# Patient Record
Sex: Male | Born: 1935 | Race: White | Hispanic: No | Smoking: Former smoker
Health system: Southern US, Community
[De-identification: ages and names within clinical notes are randomized; demographics above are authoritative.]

## PROBLEM LIST (undated history)

## (undated) DIAGNOSIS — Z87442 Personal history of urinary calculi: Secondary | ICD-10-CM

## (undated) DIAGNOSIS — I509 Heart failure, unspecified: Secondary | ICD-10-CM

## (undated) DIAGNOSIS — R319 Hematuria, unspecified: Secondary | ICD-10-CM

## (undated) DIAGNOSIS — M5126 Other intervertebral disc displacement, lumbar region: Secondary | ICD-10-CM

## (undated) DIAGNOSIS — E119 Type 2 diabetes mellitus without complications: Secondary | ICD-10-CM

## (undated) DIAGNOSIS — N183 Chronic kidney disease, stage 3 unspecified: Secondary | ICD-10-CM

## (undated) DIAGNOSIS — M1990 Unspecified osteoarthritis, unspecified site: Secondary | ICD-10-CM

## (undated) DIAGNOSIS — I219 Acute myocardial infarction, unspecified: Secondary | ICD-10-CM

## (undated) DIAGNOSIS — C61 Malignant neoplasm of prostate: Secondary | ICD-10-CM

## (undated) DIAGNOSIS — E7800 Pure hypercholesterolemia, unspecified: Secondary | ICD-10-CM

## (undated) DIAGNOSIS — Z950 Presence of cardiac pacemaker: Secondary | ICD-10-CM

## (undated) DIAGNOSIS — I10 Essential (primary) hypertension: Secondary | ICD-10-CM

## (undated) DIAGNOSIS — N12 Tubulo-interstitial nephritis, not specified as acute or chronic: Secondary | ICD-10-CM

## (undated) DIAGNOSIS — J449 Chronic obstructive pulmonary disease, unspecified: Secondary | ICD-10-CM

## (undated) HISTORY — PX: CORONARY ANGIOPLASTY WITH STENT PLACEMENT: SHX49

## (undated) HISTORY — PX: INSERTION PROSTATE RADIATION SEED: SUR718

## (undated) HISTORY — PX: CORONARY ARTERY BYPASS GRAFT: SHX141

## (undated) HISTORY — PX: APPENDECTOMY: SHX54

## (undated) HISTORY — PX: CATARACT EXTRACTION W/ INTRAOCULAR LENS  IMPLANT, BILATERAL: SHX1307

## (undated) HISTORY — PX: JOINT REPLACEMENT: SHX530

## (undated) HISTORY — PX: INGUINAL HERNIA REPAIR: SUR1180

---

## 1957-12-19 DIAGNOSIS — Z87442 Personal history of urinary calculi: Secondary | ICD-10-CM

## 1957-12-19 HISTORY — PX: CYSTOSCOPY W/ STONE MANIPULATION: SHX1427

## 1957-12-19 HISTORY — DX: Personal history of urinary calculi: Z87.442

## 2003-12-20 HISTORY — PX: TOTAL SHOULDER ARTHROPLASTY: SHX126

## 2008-12-19 HISTORY — PX: INSERT / REPLACE / REMOVE PACEMAKER: SUR710

## 2016-11-02 ENCOUNTER — Emergency Department (HOSPITAL_COMMUNITY): Payer: Medicare Other

## 2016-11-02 ENCOUNTER — Encounter (HOSPITAL_COMMUNITY): Payer: Self-pay | Admitting: *Deleted

## 2016-11-02 ENCOUNTER — Inpatient Hospital Stay (HOSPITAL_COMMUNITY)
Admission: EM | Admit: 2016-11-02 | Discharge: 2016-11-09 | DRG: 291 | Disposition: A | Discharge status: Skilled Nursing Facility | Payer: Medicare Other | Attending: Internal Medicine | Admitting: Internal Medicine

## 2016-11-02 DIAGNOSIS — I130 Hypertensive heart and chronic kidney disease with heart failure and stage 1 through stage 4 chronic kidney disease, or unspecified chronic kidney disease: Principal | ICD-10-CM | POA: Diagnosis present

## 2016-11-02 DIAGNOSIS — B3789 Other sites of candidiasis: Secondary | ICD-10-CM | POA: Diagnosis present

## 2016-11-02 DIAGNOSIS — N183 Chronic kidney disease, stage 3 unspecified: Secondary | ICD-10-CM | POA: Diagnosis present

## 2016-11-02 DIAGNOSIS — R778 Other specified abnormalities of plasma proteins: Secondary | ICD-10-CM

## 2016-11-02 DIAGNOSIS — Z882 Allergy status to sulfonamides status: Secondary | ICD-10-CM

## 2016-11-02 DIAGNOSIS — K5900 Constipation, unspecified: Secondary | ICD-10-CM | POA: Diagnosis not present

## 2016-11-02 DIAGNOSIS — R0602 Shortness of breath: Secondary | ICD-10-CM | POA: Diagnosis present

## 2016-11-02 DIAGNOSIS — N179 Acute kidney failure, unspecified: Secondary | ICD-10-CM | POA: Diagnosis present

## 2016-11-02 DIAGNOSIS — Z96611 Presence of right artificial shoulder joint: Secondary | ICD-10-CM | POA: Diagnosis present

## 2016-11-02 DIAGNOSIS — R109 Unspecified abdominal pain: Secondary | ICD-10-CM

## 2016-11-02 DIAGNOSIS — E119 Type 2 diabetes mellitus without complications: Secondary | ICD-10-CM

## 2016-11-02 DIAGNOSIS — B3742 Candidal balanitis: Secondary | ICD-10-CM

## 2016-11-02 DIAGNOSIS — R748 Abnormal levels of other serum enzymes: Secondary | ICD-10-CM | POA: Diagnosis present

## 2016-11-02 DIAGNOSIS — J449 Chronic obstructive pulmonary disease, unspecified: Secondary | ICD-10-CM | POA: Diagnosis present

## 2016-11-02 DIAGNOSIS — I5023 Acute on chronic systolic (congestive) heart failure: Secondary | ICD-10-CM | POA: Diagnosis not present

## 2016-11-02 DIAGNOSIS — Z9841 Cataract extraction status, right eye: Secondary | ICD-10-CM

## 2016-11-02 DIAGNOSIS — E1122 Type 2 diabetes mellitus with diabetic chronic kidney disease: Secondary | ICD-10-CM | POA: Diagnosis present

## 2016-11-02 DIAGNOSIS — I480 Paroxysmal atrial fibrillation: Secondary | ICD-10-CM | POA: Diagnosis not present

## 2016-11-02 DIAGNOSIS — C61 Malignant neoplasm of prostate: Secondary | ICD-10-CM | POA: Diagnosis present

## 2016-11-02 DIAGNOSIS — Z955 Presence of coronary angioplasty implant and graft: Secondary | ICD-10-CM

## 2016-11-02 DIAGNOSIS — Z9842 Cataract extraction status, left eye: Secondary | ICD-10-CM

## 2016-11-02 DIAGNOSIS — E785 Hyperlipidemia, unspecified: Secondary | ICD-10-CM

## 2016-11-02 DIAGNOSIS — Z95810 Presence of automatic (implantable) cardiac defibrillator: Secondary | ICD-10-CM

## 2016-11-02 DIAGNOSIS — N12 Tubulo-interstitial nephritis, not specified as acute or chronic: Secondary | ICD-10-CM | POA: Diagnosis present

## 2016-11-02 DIAGNOSIS — M549 Dorsalgia, unspecified: Secondary | ICD-10-CM | POA: Diagnosis not present

## 2016-11-02 DIAGNOSIS — I2510 Atherosclerotic heart disease of native coronary artery without angina pectoris: Secondary | ICD-10-CM | POA: Diagnosis not present

## 2016-11-02 DIAGNOSIS — Z951 Presence of aortocoronary bypass graft: Secondary | ICD-10-CM

## 2016-11-02 DIAGNOSIS — Z961 Presence of intraocular lens: Secondary | ICD-10-CM | POA: Diagnosis present

## 2016-11-02 DIAGNOSIS — Z87442 Personal history of urinary calculi: Secondary | ICD-10-CM

## 2016-11-02 DIAGNOSIS — B3749 Other urogenital candidiasis: Secondary | ICD-10-CM | POA: Diagnosis present

## 2016-11-02 DIAGNOSIS — E871 Hypo-osmolality and hyponatremia: Secondary | ICD-10-CM | POA: Diagnosis present

## 2016-11-02 DIAGNOSIS — Z950 Presence of cardiac pacemaker: Secondary | ICD-10-CM

## 2016-11-02 DIAGNOSIS — R7989 Other specified abnormal findings of blood chemistry: Secondary | ICD-10-CM

## 2016-11-02 DIAGNOSIS — K219 Gastro-esophageal reflux disease without esophagitis: Secondary | ICD-10-CM | POA: Diagnosis present

## 2016-11-02 DIAGNOSIS — I4891 Unspecified atrial fibrillation: Secondary | ICD-10-CM | POA: Diagnosis present

## 2016-11-02 DIAGNOSIS — I10 Essential (primary) hypertension: Secondary | ICD-10-CM | POA: Diagnosis present

## 2016-11-02 DIAGNOSIS — Z87891 Personal history of nicotine dependence: Secondary | ICD-10-CM

## 2016-11-02 HISTORY — DX: Tubulo-interstitial nephritis, not specified as acute or chronic: N12

## 2016-11-02 HISTORY — DX: Personal history of urinary calculi: Z87.442

## 2016-11-02 HISTORY — DX: Acute myocardial infarction, unspecified: I21.9

## 2016-11-02 HISTORY — DX: Type 2 diabetes mellitus without complications: E11.9

## 2016-11-02 HISTORY — DX: Chronic kidney disease, stage 3 unspecified: N18.30

## 2016-11-02 HISTORY — DX: Chronic kidney disease, stage 3 (moderate): N18.3

## 2016-11-02 HISTORY — DX: Malignant neoplasm of prostate: C61

## 2016-11-02 HISTORY — DX: Hematuria, unspecified: R31.9

## 2016-11-02 HISTORY — DX: Other intervertebral disc displacement, lumbar region: M51.26

## 2016-11-02 HISTORY — DX: Essential (primary) hypertension: I10

## 2016-11-02 HISTORY — DX: Chronic obstructive pulmonary disease, unspecified: J44.9

## 2016-11-02 HISTORY — DX: Heart failure, unspecified: I50.9

## 2016-11-02 HISTORY — DX: Presence of cardiac pacemaker: Z95.0

## 2016-11-02 HISTORY — DX: Unspecified osteoarthritis, unspecified site: M19.90

## 2016-11-02 HISTORY — DX: Pure hypercholesterolemia, unspecified: E78.00

## 2016-11-02 LAB — URINE MICROSCOPIC-ADD ON: RBC / HPF: NONE SEEN RBC/hpf (ref 0–5)

## 2016-11-02 LAB — URINALYSIS, ROUTINE W REFLEX MICROSCOPIC
BILIRUBIN URINE: NEGATIVE
GLUCOSE, UA: 250 mg/dL — AB
KETONES UR: NEGATIVE mg/dL
NITRITE: NEGATIVE
PH: 5.5 (ref 5.0–8.0)
Protein, ur: NEGATIVE mg/dL
Specific Gravity, Urine: 1.019 (ref 1.005–1.030)

## 2016-11-02 LAB — BASIC METABOLIC PANEL
ANION GAP: 11 (ref 5–15)
BUN: 50 mg/dL — ABNORMAL HIGH (ref 6–20)
CO2: 23 mmol/L (ref 22–32)
Calcium: 9.2 mg/dL (ref 8.9–10.3)
Chloride: 95 mmol/L — ABNORMAL LOW (ref 101–111)
Creatinine, Ser: 2.1 mg/dL — ABNORMAL HIGH (ref 0.61–1.24)
GFR calc Af Amer: 30 mL/min — ABNORMAL LOW (ref >60)
GFR, EST NON AFRICAN AMERICAN: 26 mL/min — AB (ref >60)
GLUCOSE: 169 mg/dL — AB (ref 65–99)
POTASSIUM: 4.7 mmol/L (ref 3.5–5.1)
Sodium: 129 mmol/L — ABNORMAL LOW (ref 135–145)

## 2016-11-02 LAB — CBC
HEMATOCRIT: 35.3 % — AB (ref 39.0–52.0)
HEMOGLOBIN: 11.2 g/dL — AB (ref 13.0–17.0)
MCH: 24.2 pg — ABNORMAL LOW (ref 26.0–34.0)
MCHC: 31.7 g/dL (ref 30.0–36.0)
MCV: 76.2 fL — AB (ref 78.0–100.0)
Platelets: 235 10*3/uL (ref 150–400)
RBC: 4.63 MIL/uL (ref 4.22–5.81)
RDW: 17.8 % — ABNORMAL HIGH (ref 11.5–15.5)
WBC: 10.1 10*3/uL (ref 4.0–10.5)

## 2016-11-02 LAB — TROPONIN I
TROPONIN I: 0.12 ng/mL — AB (ref <0.03)
Troponin I: 0.14 ng/mL (ref <0.03)

## 2016-11-02 LAB — BRAIN NATRIURETIC PEPTIDE: B NATRIURETIC PEPTIDE 5: 1885.7 pg/mL — AB (ref 0.0–100.0)

## 2016-11-02 LAB — GLUCOSE, CAPILLARY: GLUCOSE-CAPILLARY: 235 mg/dL — AB (ref 65–99)

## 2016-11-02 LAB — I-STAT TROPONIN, ED: Troponin i, poc: 0.09 ng/mL (ref 0.00–0.08)

## 2016-11-02 MED ORDER — SENNOSIDES-DOCUSATE SODIUM 8.6-50 MG PO TABS
2.0000 | ORAL_TABLET | Freq: Every evening | ORAL | Status: DC | PRN
  Administered 2016-11-03 – 2016-11-09 (×4): 2 via ORAL
  Filled 2016-11-02 (×5): qty 2

## 2016-11-02 MED ORDER — ACETAMINOPHEN 325 MG PO TABS: 650.0000 mg | ORAL_TABLET | Freq: Four times a day (QID) | ORAL | Status: DC | PRN

## 2016-11-02 MED ORDER — ACETAMINOPHEN 325 MG PO TABS
650.0000 mg | ORAL_TABLET | Freq: Once | ORAL | Status: AC
  Administered 2016-11-02: 650 mg via ORAL
  Filled 2016-11-02: qty 2

## 2016-11-02 MED ORDER — TAMSULOSIN HCL 0.4 MG PO CAPS
0.4000 mg | ORAL_CAPSULE | Freq: Every day | ORAL | Status: DC
  Administered 2016-11-02 – 2016-11-09 (×8): 0.4 mg via ORAL
  Filled 2016-11-02 (×8): qty 1

## 2016-11-02 MED ORDER — SPIRONOLACTONE 25 MG PO TABS
25.0000 mg | ORAL_TABLET | Freq: Every day | ORAL | Status: DC
  Administered 2016-11-03 – 2016-11-09 (×7): 25 mg via ORAL
  Filled 2016-11-02 (×7): qty 1

## 2016-11-02 MED ORDER — OXYCODONE-ACETAMINOPHEN 5-325 MG PO TABS
1.0000 | ORAL_TABLET | Freq: Four times a day (QID) | ORAL | Status: DC | PRN
  Administered 2016-11-03 – 2016-11-09 (×18): 1 via ORAL
  Filled 2016-11-02 (×19): qty 1

## 2016-11-02 MED ORDER — ACETAMINOPHEN 325 MG PO TABS
650.0000 mg | ORAL_TABLET | Freq: Four times a day (QID) | ORAL | Status: DC | PRN
  Administered 2016-11-02 – 2016-11-08 (×11): 650 mg via ORAL
  Filled 2016-11-02 (×11): qty 2

## 2016-11-02 MED ORDER — SODIUM CHLORIDE 0.9% FLUSH
3.0000 mL | Freq: Two times a day (BID) | INTRAVENOUS | Status: DC
  Administered 2016-11-02 – 2016-11-08 (×13): 3 mL via INTRAVENOUS

## 2016-11-02 MED ORDER — FUROSEMIDE 10 MG/ML IJ SOLN
40.0000 mg | Freq: Once | INTRAMUSCULAR | Status: AC
  Administered 2016-11-02: 40 mg via INTRAVENOUS
  Filled 2016-11-02: qty 4

## 2016-11-02 MED ORDER — CARVEDILOL 3.125 MG PO TABS
3.1250 mg | ORAL_TABLET | Freq: Two times a day (BID) | ORAL | Status: DC
  Administered 2016-11-03 – 2016-11-09 (×14): 3.125 mg via ORAL
  Filled 2016-11-02 (×15): qty 1

## 2016-11-02 MED ORDER — CEPHALEXIN 250 MG PO CAPS
500.0000 mg | ORAL_CAPSULE | Freq: Two times a day (BID) | ORAL | Status: DC
  Administered 2016-11-02: 500 mg via ORAL
  Filled 2016-11-02: qty 2

## 2016-11-02 MED ORDER — OXYCODONE-ACETAMINOPHEN 5-325 MG PO TABS
1.0000 | ORAL_TABLET | Freq: Once | ORAL | Status: AC
  Administered 2016-11-02: 1 via ORAL
  Filled 2016-11-02: qty 1

## 2016-11-02 MED ORDER — PANTOPRAZOLE SODIUM 20 MG PO TBEC
20.0000 mg | DELAYED_RELEASE_TABLET | Freq: Every day | ORAL | Status: DC
  Administered 2016-11-03 – 2016-11-09 (×7): 20 mg via ORAL
  Filled 2016-11-02 (×7): qty 1

## 2016-11-02 MED ORDER — FUROSEMIDE 10 MG/ML IJ SOLN
40.0000 mg | Freq: Every day | INTRAMUSCULAR | Status: DC
  Administered 2016-11-03 – 2016-11-05 (×3): 40 mg via INTRAVENOUS
  Filled 2016-11-02 (×4): qty 4

## 2016-11-02 MED ORDER — AMIODARONE HCL 200 MG PO TABS
200.0000 mg | ORAL_TABLET | Freq: Every day | ORAL | Status: DC
  Administered 2016-11-03 – 2016-11-09 (×7): 200 mg via ORAL
  Filled 2016-11-02 (×7): qty 1

## 2016-11-02 MED ORDER — HEPARIN SODIUM (PORCINE) 5000 UNIT/ML IJ SOLN
5000.0000 [IU] | Freq: Three times a day (TID) | INTRAMUSCULAR | Status: DC
  Administered 2016-11-02 – 2016-11-09 (×19): 5000 [IU] via SUBCUTANEOUS
  Filled 2016-11-02 (×19): qty 1

## 2016-11-02 MED ORDER — DEXTROSE 5 % IV SOLN
1.0000 g | INTRAVENOUS | Status: DC
  Administered 2016-11-02 – 2016-11-08 (×7): 1 g via INTRAVENOUS
  Filled 2016-11-02 (×8): qty 10

## 2016-11-02 MED ORDER — POLYETHYLENE GLYCOL 3350 17 G PO PACK
17.0000 g | PACK | Freq: Every day | ORAL | Status: DC
  Administered 2016-11-03 – 2016-11-06 (×4): 17 g via ORAL
  Filled 2016-11-02 (×5): qty 1

## 2016-11-02 MED ORDER — INSULIN ASPART 100 UNIT/ML ~~LOC~~ SOLN
0.0000 [IU] | Freq: Three times a day (TID) | SUBCUTANEOUS | Status: DC
  Administered 2016-11-03: 5 [IU] via SUBCUTANEOUS
  Administered 2016-11-03: 3 [IU] via SUBCUTANEOUS
  Administered 2016-11-03 – 2016-11-04 (×2): 5 [IU] via SUBCUTANEOUS
  Administered 2016-11-04: 3 [IU] via SUBCUTANEOUS
  Administered 2016-11-04: 9 [IU] via SUBCUTANEOUS
  Administered 2016-11-05: 3 [IU] via SUBCUTANEOUS
  Administered 2016-11-05: 7 [IU] via SUBCUTANEOUS
  Administered 2016-11-05 – 2016-11-06 (×2): 5 [IU] via SUBCUTANEOUS
  Administered 2016-11-06 (×2): 3 [IU] via SUBCUTANEOUS
  Administered 2016-11-07: 7 [IU] via SUBCUTANEOUS
  Administered 2016-11-07: 5 [IU] via SUBCUTANEOUS

## 2016-11-02 MED ORDER — FLUCONAZOLE 100 MG PO TABS
200.0000 mg | ORAL_TABLET | Freq: Every day | ORAL | Status: DC
  Administered 2016-11-02 – 2016-11-09 (×8): 200 mg via ORAL
  Filled 2016-11-02 (×8): qty 2

## 2016-11-02 MED ORDER — ASPIRIN 81 MG PO CHEW
81.0000 mg | CHEWABLE_TABLET | Freq: Every day | ORAL | Status: DC
Start: 2016-11-02 — End: 2016-11-09
  Administered 2016-11-02 – 2016-11-09 (×8): 81 mg via ORAL
  Filled 2016-11-02 (×8): qty 1

## 2016-11-02 MED ORDER — PRAVASTATIN SODIUM 40 MG PO TABS
40.0000 mg | ORAL_TABLET | Freq: Every day | ORAL | Status: DC
  Administered 2016-11-02 – 2016-11-09 (×8): 40 mg via ORAL
  Filled 2016-11-02 (×8): qty 1

## 2016-11-02 MED ORDER — NYSTATIN 100000 UNIT/GM EX POWD
Freq: Two times a day (BID) | CUTANEOUS | Status: DC
  Administered 2016-11-02 – 2016-11-09 (×15): via TOPICAL
  Filled 2016-11-02 (×2): qty 15

## 2016-11-02 NOTE — ED Notes (Signed)
Skin condition was reported to MD

## 2016-11-02 NOTE — ED Notes (Signed)
Troponin of 0.14 was reported to Dr. Lilyan Punt ([facility]) at this time

## 2016-11-02 NOTE — ED Notes (Signed)
Attempted Report 

## 2016-11-02 NOTE — ED Notes (Signed)
Dinner was ordered

## 2016-11-02 NOTE — ED Notes (Signed)
Attempted report 

## 2016-11-02 NOTE — ED Provider Notes (Signed)
[facility] DEPT Provider Note   CSN: [id number] Arrival date & time: 11/02/16  1000   History   Chief Complaint Chief Complaint  Patient presents with  . Back Pain  . Fall    HPI   Rodney [name] is a 82 y.o. male here for acute back pain. Patient states that he has had back pain for the last couple weeks. Was given 1 week of Cipro for a UTI about a week ago. He went to [facility] on the 12th and was given a prescription for Percocet. He also got a CT scan but does not know the results of this. Patient thinks that he may have a kidney infection. Does have history of kidney stones. Admits to intermittent hematuria. Admits to left flank pain that radiates to LLQ. Denies any fever, chills, nausea, vomiting, diarrhea. Admits to some constipation. Notes that the last time he took Percocet was yesterday morning but stopped taking it because it made him constipated. He took 2 doses of miralax yesterday.   Patient was getting ready to come to the hospital for his back pain when he felt weak in his knees and fell. He hit his face on the ground, no LOC.   Of note, patient has hx of CHF. He is short of breath at baseline but over the last couple days he has had more rapid breathing (per his daughter in law). Denies any chest pain, palpitations, or increased edema. He does have a pacemaker but it has not gone off in the last year.  Past Medical History:  Diagnosis Date  . CHF (congestive heart failure) ([place])   . COPD (chronic obstructive pulmonary disease) ([place])   . Diabetes mellitus without complication ([place])   . High cholesterol   . Prostate cancer [facility])    Diagnosed 20 years ago per patient    There are no active problems to display for this patient.   Past Surgical History:  Procedure Laterality Date  . APPENDECTOMY    . CARDIAC SURGERY    . CORONARY ARTERY BYPASS GRAFT    . PACEMAKER INSERTION  2010  . right shoulder repair     2005  Has pacemaker   Home Medications    Prior  to Admission medications   Not on File    Family History History reviewed. No pertinent family history.  Social History Social History  Substance Use Topics  . Smoking status: Never Smoker  . Smokeless tobacco: Not on file  . Alcohol use No     Allergies   Sulfa antibiotics   Review of Systems Review of Systems 10 Systems reviewed and are negative for acute change except as noted in the HPI.   Physical Exam Updated Vital Signs BP 112/70   Pulse 72   Temp 97.9 F (36.6 C) (Oral)   Resp 23   SpO2 98%   Physical Exam  Constitutional: He is oriented to person, place, and time. He appears well-developed and well-nourished.  HENT:  Head: Normocephalic.    Right Ear: External ear normal.  Left Ear: External ear normal.  Nose: No nasal deformity or septal deviation. No epistaxis. Right sinus exhibits no maxillary sinus tenderness and no frontal sinus tenderness. Left sinus exhibits no maxillary sinus tenderness and no frontal sinus tenderness.  Mouth/Throat: Oropharynx is clear and moist. No oropharyngeal exudate.  Abrasion on nose and contusion under left eye.   Eyes: Conjunctivae and EOM are normal.  Miotic pupils  Neck: Normal range of motion. Neck supple.  Cardiovascular: Normal rate and regular rhythm.  Exam reveals distant heart sounds.   Pulmonary/Chest: No accessory muscle usage. Tachypnea noted. No respiratory distress. He has no wheezes.  Abdominal: Soft. He exhibits no mass. There is tenderness (LLQ).  Genitourinary: Testes normal.  Genitourinary Comments: Initially wearing diaper. Patient has erythema of his penis and bilateral groin with white clumpy discharge.   Musculoskeletal: Normal range of motion. He exhibits edema (2+ pitting edema in bilateral ankles to mid shin). He exhibits no tenderness.  Lymphadenopathy:    He has no cervical adenopathy.  Neurological: He is alert and oriented to person, place, and time. No sensory deficit. He exhibits normal  muscle tone.  Skin: Skin is warm and dry. Capillary refill takes less than 2 seconds.  Psychiatric: He has a normal mood and affect. His behavior is normal. Judgment and thought content normal.     ED Treatments / Results  Labs (all labs ordered are listed, but only abnormal results are displayed) Labs Reviewed  URINALYSIS, ROUTINE W REFLEX MICROSCOPIC (NOT AT [facility]) - Abnormal; Notable for the following:       Result Value   APPearance HAZY (*)    Glucose, UA 250 (*)    Hgb urine dipstick TRACE (*)    Leukocytes, UA MODERATE (*)    All other components within normal limits  URINE MICROSCOPIC-ADD ON - Abnormal; Notable for the following:    Squamous Epithelial / LPF 6-30 (*)    Bacteria, UA FEW (*)    All other components within normal limits  CBC - Abnormal; Notable for the following:    Hemoglobin 11.2 (*)    HCT 35.3 (*)    MCV 76.2 (*)    MCH 24.2 (*)    RDW 17.8 (*)    All other components within normal limits  BASIC METABOLIC PANEL - Abnormal; Notable for the following:    Sodium 129 (*)    Chloride 95 (*)    Glucose, Bld 169 (*)    BUN 50 (*)    Creatinine, Ser 2.10 (*)    GFR calc non Af Amer 26 (*)    GFR calc Af Amer 30 (*)    All other components within normal limits  BRAIN NATRIURETIC PEPTIDE - Abnormal; Notable for the following:    B Natriuretic Peptide 1,885.7 (*)    All other components within normal limits  I-STAT TROPOININ, ED - Abnormal; Notable for the following:    Troponin i, poc 0.09 (*)    All other components within normal limits  URINE CULTURE    EKG  EKG Interpretation  Date/Time:  Wednesday November 02 2016 14:00:46 EST Ventricular Rate:  71 PR Interval:    QRS Duration: 172 QT Interval:  514 QTC Calculation: 559 R Axis:   -81 Text Interpretation:  Accelerated junctional rhythm Nonspecific IVCD with LAD Inferior infarct, old Anterior infarct, old Abnrm T, consider ischemia, anterolateral lds No previous ECGs available Confirmed by  [facility] MD, ERIN ([id number]) on 11/02/2016 3:12:49 PM       Radiology Dg Chest 2 View  Result Date: 11/02/2016 CLINICAL DATA:  Dizziness and weakness.  Status post fall yesterday. EXAM: CHEST  2 VIEW COMPARISON:  None. FINDINGS: The patient is status post CABG with a pacing device in place. There is marked cardiomegaly but no pulmonary edema. No consolidative process, pneumothorax or effusion. Mid and lower thoracic compression fractures appear remote. IMPRESSION: Cardiomegaly without acute disease. Electronically Signed   By: Inge Rise M.D.  On: 11/02/2016 14:47   Ct Head Wo Contrast  Result Date: 11/02/2016 CLINICAL DATA:  Status post fall today. Bruising about the right eye. Initial encounter. EXAM: CT HEAD WITHOUT CONTRAST TECHNIQUE: Contiguous axial images were obtained from the base of the skull through the vertex without intravenous contrast. COMPARISON:  None. FINDINGS: Brain: Atrophy and chronic microvascular ischemic change are seen. No evidence of acute abnormality including hemorrhage, infarct, mass lesion, mass effect, midline shift or abnormal extra-axial fluid collection. No hydrocephalus or pneumocephalus. Vascular: Extensive atherosclerotic vascular disease is seen. Skull: Intact. Sinuses/Orbits: Unremarkable. Other: None. IMPRESSION: No acute abnormality. Atrophy and chronic microvascular ischemic change. Atherosclerosis. Electronically Signed   By: Inge Rise M.D.   On: 11/02/2016 15:02   Dg Knee Complete 4 Views Left  Result Date: 11/02/2016 CLINICAL DATA:  Fall EXAM: LEFT KNEE - COMPLETE 4+ VIEW COMPARISON:  None. FINDINGS: No acute fracture. No dislocation. Unremarkable soft tissues. Advanced tricompartment osteoarthritic change. IMPRESSION: No acute bony pathology. Electronically Signed   By: Marybelle Killings M.D.   On: 11/02/2016 14:47    Procedures Procedures (including critical care time)  Medications Ordered in ED Medications  cephALEXin (KEFLEX) capsule  500 mg (500 mg Oral Given 11/02/16 1358)  fluconazole (DIFLUCAN) tablet 200 mg (200 mg Oral Given 11/02/16 1415)  nystatin (MYCOSTATIN/NYSTOP) topical powder (not administered)  oxyCODONE-acetaminophen (PERCOCET/ROXICET) 5-325 MG per tablet 1 tablet (1 tablet Oral Given 11/02/16 1320)  furosemide (LASIX) injection 40 mg (40 mg Intravenous Given 11/02/16 1556)  acetaminophen (TYLENOL) tablet 650 mg (650 mg Oral Given 11/02/16 1613)     Initial Impression / Assessment and Plan / ED Course  I have reviewed the triage vital signs and the nursing notes.  Pertinent labs & imaging results that were available during my care of the patient were reviewed by me and considered in my medical decision making (see chart for details).  Clinical Course    Rodney [name] is a 82 y.o. male with PMH of CHF w/ pacemaker, T2DM, and HLD who presented with L flank pain. Vital signs normal and patient afebrile. Physical exam showing L flank tenderness with radiation to LLQ. UA showing few bacteria, trace hgb, mod leukocytes, + yeast, with 6-30 squams (dirty catch). Will treat for pyelonephritis at this time with Keflex and Diflucan. Unlikely nephrolithiasis as CT abdomen at [facility] on 11/12 showed no stones. Pain could also be MSK in origin.   Patient has some injuries from falling on his way to the hospital; black L eye, abrasion on nose, and right hand. CT head was done and was unremarkable. Had some difficulty extending left knee. Left knee XRAY unremarkable.   Patient appeared SOB with peripheral edema on exam. No hypoxia. Has known heart failure. CXR showed no cardiopulmonary disease. BNP elevated at 1,885.7. Troponin elevated at 0.09. Per care everywhere patient had troponin of 0.05 on 11/12 at [facility] ED. EKG showing no ST changes. 40 mg IV Lasix given.   Discussed patient with Dr. Cruzita Lederer who agreed to accept patient for admission.    Final Clinical Impressions(s) / ED Diagnoses   Final diagnoses:  Shortness  of breath  Elevated brain natriuretic peptide (BNP) level  Elevated troponin  Pyelonephritis  Candidiasis of penis  Left flank pain    New Prescriptions New Prescriptions   No medications on file     Carlyle Dolly, MD 11/02/16 1643    Gareth Morgan, MD 11/03/16 2237

## 2016-11-02 NOTE — ED Notes (Signed)
[facility] adm MD paged

## 2016-11-02 NOTE — H&P (Signed)
History and Physical    [name] [id number] DOB: 1923/08/17 DOA: 11/02/2016  PCP: No primary care provider on file.  Outpatient Specialists: all specialists at [facility] Patient coming from: home   Chief Complaint: back pain   HPI: Rodney [name] is a 82 y.o. male with medical history significant of chronic systolic CHF, paroxysmal atrial fibrillation, diabetes mellitus, hypertension, hyperlipidemia, chronic kidney disease stage III, prior nephrolithiasis presents to the emergency room with chief complaint of left flank and back pain. Patient gets most of his care at [facility], and he was seen about 3 days ago in urgent care and was diagnosed with a urinary tract infection and concern for pyelonephritis, he was given ciprofloxacin. He did not feel like his back pain got any better, and presented to the [facility] 2 days ago, and he underwent a CT scan of the abdomen and pelvis without contrast to evaluate for kidney stones, and this was negative. Recent also has been complaining of progressive weakness, and today he was so weak that he had a ground-level fall, denies any loss of consciousness, denies any chest pain. Patient has been noticing more lower extremity swelling, and states that he has gained about 1 pound over the last week. When asked about dietary compliance he says "I eat a lot". He complains of mild shortness of breath over the last couple days. He denies any palpitations. He denies any fever or chills. He has no dysuria. His flank pain is on the left side, also feels into his left groin, and denies any colicky features to it.   ED Course: In the emergency room, patient's vital signs are stable, he is afebrile, urinalysis shows possible UTI as well as some yeast, chest x-ray did not show any significant pulmonary edema however did show cardiomegaly, CT scan of the head was unremarkable. His blood work is significant for hyponatremia, he has renal failure with a  creatinine of 2.1, he had a BNP elevated to 1800 and a slightly elevated troponin of 0.09. Given her flank pain and concern for pyelonephritis as well as clinical evidence of acute on chronic CHF, [facility] was asked for admission.  Review of Systems: As per HPI otherwise 10 point review of systems negative.   Past Medical History:  Diagnosis Date  . CHF (congestive heart failure) ([place])   . COPD (chronic obstructive pulmonary disease) ([place])   . Diabetes mellitus without complication ([place])   . High cholesterol   . Prostate cancer [facility])    Diagnosed 20 years ago per patient    Past Surgical History:  Procedure Laterality Date  . APPENDECTOMY    . CARDIAC SURGERY    . CORONARY ARTERY BYPASS GRAFT    . PACEMAKER INSERTION  2010  . right shoulder repair     2005     reports that he has never smoked. He does not have any smokeless tobacco history on file. He reports that he does not drink alcohol or use drugs.  Allergies  Allergen Reactions  . Sulfa Antibiotics     History reviewed. No pertinent family history.  Prior to Admission medications   Not on File    Medications: Current Outpatient Prescriptions  Medication Sig Dispense Refill  . AMIOdarone (PACERONE) 200 MG tablet Take 200 mg by mouth once daily.  Marland Kitchen aspirin 325 MG tablet Take 162.5 mg by mouth once daily.  . carvedilol (COREG) 12.5 MG tablet Take 1 tablet (12.5 mg total) by mouth every 12 (twelve) hours.  60 tablet 3  . insulin LISPRO (HUMALOG) injection (concentration 100 units/mL) Inject 6 Units subcutaneously 3 (three) times daily with meals. 10 mL 0  . mupirocin (BACTROBAN) 2 % ointment as needed. 0  . nitroGLYcerin (NITROSTAT) 0.4 MG SL tablet Place 1 tablet (0.4 mg total) under the tongue every 5 (five) minutes as needed for Chest pain. May take up to 3 doses. 25 tablet 3  . pantoprazole (PROTONIX) 40 MG DR tablet Take 20 mg by mouth once daily.  Marland Kitchen POLYETHYLENE GLYCOL 3350 (MIRALAX ORAL) Take by mouth once daily.  .  pravastatin (PRAVACHOL) 40 MG tablet Take 40 mg by mouth nightly.  . sennosides-docusate (SENOKOT-S) 8.6-50 mg tablet Take 2 tablets by mouth 2 (two) times daily. 120 tablet 0  . spironolactone (ALDACTONE) 25 MG tablet Take 0.5 tablets (12.5 mg total) by mouth once daily. 15 tablet 11  . tamsulosin (FLOMAX) 0.4 mg capsule Take 1 capsule (0.4 mg total) by mouth nightly. Take 30 minutes after same meal each day. 30 capsule 3  . TORsemide (DEMADEX) 20 MG tablet Take 2.5 tablets (50 mg total) by mouth once daily. 60 tablet 3  . triamcinolone 0.025 % lotion as needed. 0  . UNABLE TO FIND Fiber oral daily    Physical Exam: Vitals:   11/02/16 1230 11/02/16 1315 11/02/16 1400 11/02/16 1415  BP: 97/56 115/69 123/74 112/70  Pulse: 70 71 71 72  Resp: 16  20 23   Temp:      TempSrc:      SpO2: 92% 99% 99% 98%   Constitutional: NAD, calm, comfortable Vitals:   11/02/16 1230 11/02/16 1315 11/02/16 1400 11/02/16 1415  BP: 97/56 115/69 123/74 112/70  Pulse: 70 71 71 72  Resp: 16  20 23   Temp:      TempSrc:      SpO2: 92% 99% 99% 98%   Eyes: PERRL, lids and conjunctivae normal. Periorbital ecchymosis left eye ENMT: Mucous membranes are moist.  Neck: normal, supple, no masses, no thyromegaly Respiratory: clear to auscultation bilaterally, no wheezing, no crackles. Overall distant breath sounds  Cardiovascular: Regular rate and rhythm, no murmurs / rubs / gallops. 2+ extremity edema. 2+ pedal pulses.  Abdomen: no tenderness, no masses palpated. Bowel sounds positive.  Musculoskeletal: no clubbing / cyanosis. Normal muscle tone.  Skin: Several abrasions in the right hand  Neurologic: CN 2-12 grossly intact. Strength 5/5 in all 4.  Psychiatric: Normal judgment and insight. Alert and oriented x 3. Normal mood.   Labs on Admission: I have personally reviewed following labs and imaging studies  CBC:  Recent Labs Lab 11/02/16 1314  WBC 10.1  HGB 11.2*  HCT 35.3*  MCV 76.2*  PLT [id number]   Basic  Metabolic Panel:  Recent Labs Lab 11/02/16 1314  NA 129*  K 4.7  CL 95*  CO2 23  GLUCOSE 169*  BUN 50*  CREATININE 2.10*  CALCIUM 9.2   GFR: CrCl cannot be calculated (Unknown ideal weight.). Liver Function Tests: No results for input(s): AST, ALT, ALKPHOS, BILITOT, PROT, ALBUMIN in the last 168 hours. No results for input(s): LIPASE, AMYLASE in the last 168 hours. No results for input(s): AMMONIA in the last 168 hours. Coagulation Profile: No results for input(s): INR, PROTIME in the last 168 hours. Cardiac Enzymes: No results for input(s): CKTOTAL, CKMB, CKMBINDEX, TROPONINI in the last 168 hours. BNP (last 3 results) No results for input(s): PROBNP in the last 8760 hours. HbA1C: No results for input(s): HGBA1C in the last 72  hours. CBG: No results for input(s): GLUCAP in the last 168 hours. Lipid Profile: No results for input(s): CHOL, HDL, LDLCALC, TRIG, CHOLHDL, LDLDIRECT in the last 72 hours. Thyroid Function Tests: No results for input(s): TSH, T4TOTAL, FREET4, T3FREE, THYROIDAB in the last 72 hours. Anemia Panel: No results for input(s): VITAMINB12, FOLATE, FERRITIN, TIBC, IRON, RETICCTPCT in the last 72 hours. Urine analysis:    Component Value Date/Time   COLORURINE YELLOW 11/02/2016 1027   APPEARANCEUR HAZY (A) 11/02/2016 1027   LABSPEC 1.019 11/02/2016 1027   PHURINE 5.5 11/02/2016 1027   GLUCOSEU 250 (A) 11/02/2016 1027   HGBUR TRACE (A) 11/02/2016 1027   BILIRUBINUR NEGATIVE 11/02/2016 1027   KETONESUR NEGATIVE 11/02/2016 1027   PROTEINUR NEGATIVE 11/02/2016 1027   NITRITE NEGATIVE 11/02/2016 1027   LEUKOCYTESUR MODERATE (A) 11/02/2016 1027   Sepsis Labs: @LABRCNTIP (procalcitonin:4,lacticidven:4) )No results found for this or any previous visit (from the past 240 hour(s)).   Radiological Exams on Admission: Dg Chest 2 View  Result Date: 11/02/2016 CLINICAL DATA:  Dizziness and weakness.  Status post fall yesterday. EXAM: CHEST  2 VIEW  COMPARISON:  None. FINDINGS: The patient is status post CABG with a pacing device in place. There is marked cardiomegaly but no pulmonary edema. No consolidative process, pneumothorax or effusion. Mid and lower thoracic compression fractures appear remote. IMPRESSION: Cardiomegaly without acute disease. Electronically Signed   By: Inge Rise M.D.   On: 11/02/2016 14:47   Ct Head Wo Contrast  Result Date: 11/02/2016 CLINICAL DATA:  Status post fall today. Bruising about the right eye. Initial encounter. EXAM: CT HEAD WITHOUT CONTRAST TECHNIQUE: Contiguous axial images were obtained from the base of the skull through the vertex without intravenous contrast. COMPARISON:  None. FINDINGS: Brain: Atrophy and chronic microvascular ischemic change are seen. No evidence of acute abnormality including hemorrhage, infarct, mass lesion, mass effect, midline shift or abnormal extra-axial fluid collection. No hydrocephalus or pneumocephalus. Vascular: Extensive atherosclerotic vascular disease is seen. Skull: Intact. Sinuses/Orbits: Unremarkable. Other: None. IMPRESSION: No acute abnormality. Atrophy and chronic microvascular ischemic change. Atherosclerosis. Electronically Signed   By: Inge Rise M.D.   On: 11/02/2016 15:02   Dg Knee Complete 4 Views Left  Result Date: 11/02/2016 CLINICAL DATA:  Fall EXAM: LEFT KNEE - COMPLETE 4+ VIEW COMPARISON:  None. FINDINGS: No acute fracture. No dislocation. Unremarkable soft tissues. Advanced tricompartment osteoarthritic change. IMPRESSION: No acute bony pathology. Electronically Signed   By: Marybelle Killings M.D.   On: 11/02/2016 14:47    EKG: Independently reviewed. Junctional rhythm  Assessment/Plan Active Problems:   Pyelonephritis   Acute on chronic systolic CHF (congestive heart failure) (HCC)   DM (diabetes mellitus) (HCC)   Atrial fibrillation (HCC)   HTN (hypertension)   HLD (hyperlipidemia)   ICD (implantable cardioverter-defibrillator) in place    CKD (chronic kidney disease) stage 3, GFR 30-59 ml/min   Concern for pyelonephritis - We'll obtain urine cultures, however he has been on ciprofloxacin and cultures may remain negative. Start ceftriaxone for now. Yeast also present, started on Diflucan in the ER, continue - Patient has CVA tenderness on exam, however lacks fever/dysuria which may be masked by the fact that he has been taking antibiotics for a few days  Acute on chronic systolic CHF - Most recent 2-D echo was done at [facility] in June 2017 and showed ejection fraction of 30%. Will not repeat a 2-D echo, he is not hypoxic, does not have 480 on the chest x-ray, has mild exacerbation with lower  extremity swelling. Has received IV Lasix in the ER, start scheduled with first dose tomorrow, closely monitor renal function and urine output - Resume Coreg, resume spironolactone  Atrial fibrillation - patient's CHA2DS2-VASc Score for Stroke Risk is at least 5, he has been on Coumadin at one point in the past, however had what seems like a GI bleed and he was taken off of anticoagulation. Continue aspirin. - Continue amiodarone as well as beta blocker  Type 2 diabetes mellitus - Place patient on sliding scale insulin  Acute kidney injury on chronic kidney disease stage III - Baseline creatinine around 1.3-1.4 based on [facility] records, he has now increased to 2.1 likely multifactorial in the setting of diabetes, hypertension,? Component of cardiorenal syndrome given her long-standing CHF. Creatinine was elevated at 2.1 3 days ago when he was evaluated in the emergency room at [facility] - Closely monitor renal function while on IV Lasix  Coronary artery disease - Patient had a recent evaluation in June 2017 at [facility] with a myocardial perfusion scan, without any reversible perfusion defects. Has no chest pain. Continue aspirin, beta blocker and statin - Slight troponin elevation likely due to #2, cycle cardiac enzymes overnight  Hyponatremia -  Clinically appears fluid overloaded, given IV Lasix and monitor sodium levels. Most recent sodium level was 137 on 10/30/2016   Hypertension - decrease Coreg dose was receiving IV Lasix, had 1 soft blood pressure of 90 systolic in the ER, asymptomatic  Hyperlipidemia - Continue statin  GERD - on PPI   DVT prophylaxis: heparin  Code Status: Full  Family Communication: d/w daughter in law bedside Disposition Plan: admit to telemetry  Consults called: none  Admission status: inpatient, IV antibiotics, IV diuresis    Marzetta Board, MD [facility] Pager [phone]  If 7PM-7AM, please contact night-coverage [email or web address] Password [email or web address]  11/02/2016, 5:05 PM

## 2016-11-02 NOTE — ED Notes (Signed)
Peri care was done. Discharge, redness/rash noted

## 2016-11-02 NOTE — ED Triage Notes (Signed)
Pt reports recent treatment for kidney infection. Having severe back pain and was coming here for eval of his back pain when he fell due to his knees giving out on him. Denies loc. Has bruising noted to left eye and small laceration to nose. pts main concern is his back pain.

## 2016-11-03 DIAGNOSIS — C61 Malignant neoplasm of prostate: Secondary | ICD-10-CM | POA: Diagnosis present

## 2016-11-03 DIAGNOSIS — B3749 Other urogenital candidiasis: Secondary | ICD-10-CM | POA: Diagnosis present

## 2016-11-03 DIAGNOSIS — I2510 Atherosclerotic heart disease of native coronary artery without angina pectoris: Secondary | ICD-10-CM | POA: Diagnosis present

## 2016-11-03 DIAGNOSIS — Z950 Presence of cardiac pacemaker: Secondary | ICD-10-CM | POA: Diagnosis not present

## 2016-11-03 DIAGNOSIS — E871 Hypo-osmolality and hyponatremia: Secondary | ICD-10-CM | POA: Diagnosis present

## 2016-11-03 DIAGNOSIS — I480 Paroxysmal atrial fibrillation: Secondary | ICD-10-CM | POA: Diagnosis not present

## 2016-11-03 DIAGNOSIS — B3789 Other sites of candidiasis: Secondary | ICD-10-CM | POA: Diagnosis present

## 2016-11-03 DIAGNOSIS — I130 Hypertensive heart and chronic kidney disease with heart failure and stage 1 through stage 4 chronic kidney disease, or unspecified chronic kidney disease: Secondary | ICD-10-CM | POA: Diagnosis present

## 2016-11-03 DIAGNOSIS — N179 Acute kidney failure, unspecified: Secondary | ICD-10-CM | POA: Diagnosis present

## 2016-11-03 DIAGNOSIS — R748 Abnormal levels of other serum enzymes: Secondary | ICD-10-CM | POA: Diagnosis present

## 2016-11-03 DIAGNOSIS — M549 Dorsalgia, unspecified: Secondary | ICD-10-CM | POA: Diagnosis present

## 2016-11-03 DIAGNOSIS — N183 Chronic kidney disease, stage 3 (moderate): Secondary | ICD-10-CM | POA: Diagnosis present

## 2016-11-03 DIAGNOSIS — Z95810 Presence of automatic (implantable) cardiac defibrillator: Secondary | ICD-10-CM | POA: Diagnosis not present

## 2016-11-03 DIAGNOSIS — K5900 Constipation, unspecified: Secondary | ICD-10-CM | POA: Diagnosis not present

## 2016-11-03 DIAGNOSIS — N12 Tubulo-interstitial nephritis, not specified as acute or chronic: Secondary | ICD-10-CM | POA: Diagnosis present

## 2016-11-03 DIAGNOSIS — E1122 Type 2 diabetes mellitus with diabetic chronic kidney disease: Secondary | ICD-10-CM | POA: Diagnosis present

## 2016-11-03 DIAGNOSIS — K219 Gastro-esophageal reflux disease without esophagitis: Secondary | ICD-10-CM | POA: Diagnosis present

## 2016-11-03 DIAGNOSIS — I5023 Acute on chronic systolic (congestive) heart failure: Secondary | ICD-10-CM | POA: Diagnosis not present

## 2016-11-03 DIAGNOSIS — Z9842 Cataract extraction status, left eye: Secondary | ICD-10-CM | POA: Diagnosis not present

## 2016-11-03 DIAGNOSIS — Z87442 Personal history of urinary calculi: Secondary | ICD-10-CM | POA: Diagnosis not present

## 2016-11-03 DIAGNOSIS — Z882 Allergy status to sulfonamides status: Secondary | ICD-10-CM | POA: Diagnosis not present

## 2016-11-03 DIAGNOSIS — E785 Hyperlipidemia, unspecified: Secondary | ICD-10-CM | POA: Diagnosis present

## 2016-11-03 DIAGNOSIS — J449 Chronic obstructive pulmonary disease, unspecified: Secondary | ICD-10-CM | POA: Diagnosis present

## 2016-11-03 DIAGNOSIS — Z951 Presence of aortocoronary bypass graft: Secondary | ICD-10-CM | POA: Diagnosis not present

## 2016-11-03 LAB — CBC
HCT: 32.2 % — ABNORMAL LOW (ref 39.0–52.0)
HEMOGLOBIN: 10.2 g/dL — AB (ref 13.0–17.0)
MCH: 24.2 pg — AB (ref 26.0–34.0)
MCHC: 31.7 g/dL (ref 30.0–36.0)
MCV: 76.3 fL — ABNORMAL LOW (ref 78.0–100.0)
Platelets: 219 10*3/uL (ref 150–400)
RBC: 4.22 MIL/uL (ref 4.22–5.81)
RDW: 18.2 % — ABNORMAL HIGH (ref 11.5–15.5)
WBC: 8.1 10*3/uL (ref 4.0–10.5)

## 2016-11-03 LAB — GLUCOSE, CAPILLARY
GLUCOSE-CAPILLARY: 264 mg/dL — AB (ref 65–99)
Glucose-Capillary: 263 mg/dL — ABNORMAL HIGH (ref 65–99)
Glucose-Capillary: 244 mg/dL — ABNORMAL HIGH (ref 65–99)
Glucose-Capillary: 260 mg/dL — ABNORMAL HIGH (ref 65–99)

## 2016-11-03 LAB — COMPREHENSIVE METABOLIC PANEL
ALK PHOS: 122 U/L (ref 38–126)
ALT: 22 U/L (ref 17–63)
ANION GAP: 7 (ref 5–15)
AST: 25 U/L (ref 15–41)
Albumin: 3.4 g/dL — ABNORMAL LOW (ref 3.5–5.0)
BUN: 40 mg/dL — ABNORMAL HIGH (ref 6–20)
CALCIUM: 8.8 mg/dL — AB (ref 8.9–10.3)
CHLORIDE: 96 mmol/L — AB (ref 101–111)
CO2: 27 mmol/L (ref 22–32)
Creatinine, Ser: 1.77 mg/dL — ABNORMAL HIGH (ref 0.61–1.24)
GFR calc non Af Amer: 32 mL/min — ABNORMAL LOW (ref >60)
GFR, EST AFRICAN AMERICAN: 37 mL/min — AB (ref >60)
Glucose, Bld: 245 mg/dL — ABNORMAL HIGH (ref 65–99)
Potassium: 4.8 mmol/L (ref 3.5–5.1)
SODIUM: 130 mmol/L — AB (ref 135–145)
Total Bilirubin: 1.3 mg/dL — ABNORMAL HIGH (ref 0.3–1.2)
Total Protein: 6.4 g/dL — ABNORMAL LOW (ref 6.5–8.1)

## 2016-11-03 LAB — URINE CULTURE

## 2016-11-03 LAB — TROPONIN I: Troponin I: 0.1 ng/mL (ref <0.03)

## 2016-11-03 LAB — TSH: TSH: 8.173 u[IU]/mL — AB (ref 0.350–4.500)

## 2016-11-03 MED ORDER — INSULIN GLARGINE 100 UNIT/ML ~~LOC~~ SOLN
7.0000 [IU] | Freq: Every day | SUBCUTANEOUS | Status: DC
  Administered 2016-11-03 – 2016-11-08 (×6): 7 [IU] via SUBCUTANEOUS
  Filled 2016-11-03 (×6): qty 0.07

## 2016-11-03 NOTE — Progress Notes (Signed)
PROGRESS NOTE  Rodney [name] [id number] DOB: [birth year] DOA: 11/02/2016 PCP: Mitzie Na, MD   LOS: 1 day   Brief Narrative: 82 y.o. male with medical history significant of chronic systolic CHF, paroxysmal atrial fibrillation, diabetes mellitus, hypertension, hyperlipidemia, chronic kidney disease stage III, prior nephrolithiasis presents to the emergency room with chief complaint of left flank and back pain. He was admitted with concerns for UTI / pyelonephrotics as well as AOC CHF.  Assessment & Plan: Active Problems:   Pyelonephritis   Acute on chronic systolic CHF (congestive heart failure) (HCC)   DM (diabetes mellitus) (HCC)   Atrial fibrillation (HCC)   HTN (hypertension)   HLD (hyperlipidemia)   ICD (implantable cardioverter-defibrillator) in place   CKD (chronic kidney disease) stage 3, GFR 30-59 ml/min   Hyponatremia   CAD (coronary artery disease)   Shortness of breath   Concern for pyelonephritis / flank pain - urine cultures sent and pending, however he has been on ciprofloxacin and cultures may remain negative.  - continue Ceftriaxone for now. Improving - Patient has CVA tenderness on exam still. Pain may be MSK, d/c Abx if cultures remain negative  Acute on chronic systolic CHF - Most recent 2-D echo was done at [facility] in June 2017 and showed ejection fraction of 30%. - Will not repeat a 2-D echo - Resume Coreg, resume spironolactone - continue IV Lasix  Atrial fibrillation - patient's CHA2DS2-VASc Score for Stroke Risk is at least 5, he has been on Coumadin at one point in the past, however had what seems like a GI bleed and he was taken off of anticoagulation. Continue aspirin. - Continue amiodarone as well as beta blocker  Type 2 diabetes mellitus - Place patient on sliding scale insulin - add Lantus today   Acute kidney injury on chronic kidney disease stage III - Baseline creatinine around 1.3-1.4 based on [facility] records, it has  increased to 2.1 on admission likely multifactorial in the setting of diabetes, hypertension,? Component of cardiorenal syndrome given long-standing CHF.  - Cr improving today with diuresis, continue Lasix  Coronary artery disease - Patient had a recent evaluation in June 2017 at [facility] with a myocardial perfusion scan, without any reversible perfusion defects. Has no chest pain. Continue aspirin, beta blocker and statin - Slight troponin elevation likely due to #2, cardiac enzymes flat  Hyponatremia - Clinically appears fluid overloaded, given IV Lasix and monitor sodium levels. - improving today   Hypertension - decrease Coreg dose was receiving IV Lasix, had 1 soft blood pressure of 90 systolic in the ER, asymptomatic  Hyperlipidemia - Continue statin  GERD - on PPI   DVT prophylaxis: heparin Code Status: Full Family Communication: no family bedside Disposition Plan: home 1-2 days  Consultants:   None   Procedures:   None   Antimicrobials:  Ceftriaxone 11/15 >>   Subjective: - no chest pain, no abdominal pain, nausea or vomiting.  - mild shortness of breath today; back flank pain still present  Objective: Vitals:   11/02/16 2126 11/03/16 0324 11/03/16 0522 11/03/16 0816  BP:  108/67  115/72  Pulse:  75  70  Resp:  20    Temp:  97.8 F (36.6 C)    TempSrc:  Oral    SpO2:  95%    Weight: 80.4 kg (177 lb 4.8 oz)  79.4 kg (175 lb 1.6 oz)   Height: 5\' 7"  (1.702 m)       Intake/Output Summary (Last 24 hours) at  11/03/16 1204 Last data filed at 11/03/16 1100  Gross per 24 hour  Intake              480 ml  Output             2600 ml  Net            -2120 ml   Filed Weights   11/02/16 2126 11/03/16 0522  Weight: 80.4 kg (177 lb 4.8 oz) 79.4 kg (175 lb 1.6 oz)    Examination: Constitutional: NAD Vitals:   11/02/16 2126 11/03/16 0324 11/03/16 0522 11/03/16 0816  BP:  108/67  115/72  Pulse:  75  70  Resp:  20    Temp:  97.8 F (36.6 C)    TempSrc:   Oral    SpO2:  95%    Weight: 80.4 kg (177 lb 4.8 oz)  79.4 kg (175 lb 1.6 oz)   Height: 5\' 7"  (1.702 m)      Eyes: PERRL, lids and conjunctivae normal. Periorbital ecchymosis left eye Respiratory: clear to auscultation bilaterally, no wheezing, no crackles. Overall distant breath sounds  Cardiovascular: Regular rate and rhythm, no murmurs / rubs / gallops. 2+ extremity edema. 2+ pedal pulses.  Abdomen: no tenderness, no masses palpated. Bowel sounds positive.  Musculoskeletal: no clubbing / cyanosis. Normal muscle tone.  Skin: Several abrasions in the right hand  Neurologic: non focal    Data Reviewed: I have personally reviewed following labs and imaging studies  CBC:  Recent Labs Lab 11/02/16 1314 11/03/16 0458  WBC 10.1 8.1  HGB 11.2* 10.2*  HCT 35.3* 32.2*  MCV 76.2* 76.3*  PLT 235 [id number]   Basic Metabolic Panel:  Recent Labs Lab 11/02/16 1314 11/03/16 0458  NA 129* 130*  K 4.7 4.8  CL 95* 96*  CO2 23 27  GLUCOSE 169* 245*  BUN 50* 40*  CREATININE 2.10* 1.77*  CALCIUM 9.2 8.8*   GFR: Estimated Creatinine Clearance: 27.5 mL/min (by C-G formula based on SCr of 1.77 mg/dL (H)). Liver Function Tests:  Recent Labs Lab 11/03/16 0458  AST 25  ALT 22  ALKPHOS 122  BILITOT 1.3*  PROT 6.4*  ALBUMIN 3.4*   Cardiac Enzymes:  Recent Labs Lab 11/02/16 1656 11/02/16 2234 11/03/16 0458  TROPONINI 0.14* 0.12* 0.10*   CBG:  Recent Labs Lab 11/02/16 2153 11/03/16 0607 11/03/16 1107  GLUCAP 235* 263* 244*   Thyroid Function Tests:  Recent Labs  11/03/16 0458  TSH 8.173*   Urine analysis:    Component Value Date/Time   COLORURINE YELLOW 11/02/2016 1027   APPEARANCEUR HAZY (A) 11/02/2016 1027   LABSPEC 1.019 11/02/2016 1027   PHURINE 5.5 11/02/2016 1027   GLUCOSEU 250 (A) 11/02/2016 1027   HGBUR TRACE (A) 11/02/2016 1027   BILIRUBINUR NEGATIVE 11/02/2016 1027   KETONESUR NEGATIVE 11/02/2016 1027   PROTEINUR NEGATIVE 11/02/2016 1027   NITRITE  NEGATIVE 11/02/2016 1027   LEUKOCYTESUR MODERATE (A) 11/02/2016 1027   Radiology Studies: Dg Chest 2 View  Result Date: 11/02/2016 CLINICAL DATA:  Dizziness and weakness.  Status post fall yesterday. EXAM: CHEST  2 VIEW COMPARISON:  None. FINDINGS: The patient is status post CABG with a pacing device in place. There is marked cardiomegaly but no pulmonary edema. No consolidative process, pneumothorax or effusion. Mid and lower thoracic compression fractures appear remote. IMPRESSION: Cardiomegaly without acute disease. Electronically Signed   By: Inge Rise M.D.   On: 11/02/2016 14:47   Ct Head Wo Contrast  Result  Date: 11/02/2016 CLINICAL DATA:  Status post fall today. Bruising about the right eye. Initial encounter. EXAM: CT HEAD WITHOUT CONTRAST TECHNIQUE: Contiguous axial images were obtained from the base of the skull through the vertex without intravenous contrast. COMPARISON:  None. FINDINGS: Brain: Atrophy and chronic microvascular ischemic change are seen. No evidence of acute abnormality including hemorrhage, infarct, mass lesion, mass effect, midline shift or abnormal extra-axial fluid collection. No hydrocephalus or pneumocephalus. Vascular: Extensive atherosclerotic vascular disease is seen. Skull: Intact. Sinuses/Orbits: Unremarkable. Other: None. IMPRESSION: No acute abnormality. Atrophy and chronic microvascular ischemic change. Atherosclerosis. Electronically Signed   By: Inge Rise M.D.   On: 11/02/2016 15:02   Dg Knee Complete 4 Views Left  Result Date: 11/02/2016 CLINICAL DATA:  Fall EXAM: LEFT KNEE - COMPLETE 4+ VIEW COMPARISON:  None. FINDINGS: No acute fracture. No dislocation. Unremarkable soft tissues. Advanced tricompartment osteoarthritic change. IMPRESSION: No acute bony pathology. Electronically Signed   By: Marybelle Killings M.D.   On: 11/02/2016 14:47     Scheduled Meds: . amiodarone  200 mg Oral Daily  . aspirin  81 mg Oral Daily  . carvedilol  3.125 mg  Oral BID WC  . cefTRIAXone (ROCEPHIN)  IV  1 g Intravenous Q24H  . fluconazole  200 mg Oral Daily  . furosemide  40 mg Intravenous Daily  . heparin  5,000 Units Subcutaneous Q8H  . insulin aspart  0-9 Units Subcutaneous TID WC  . insulin glargine  7 Units Subcutaneous Daily  . nystatin   Topical BID  . pantoprazole  20 mg Oral Daily  . polyethylene glycol  17 g Oral Daily  . pravastatin  40 mg Oral q1800  . sodium chloride flush  3 mL Intravenous Q12H  . spironolactone  25 mg Oral Daily  . tamsulosin  0.4 mg Oral QPC supper   Continuous Infusions:   Marzetta Board, MD, PhD [facility] Pager [phone]  If 7PM-7AM, please contact night-coverage [email or web address] Password [email or web address] 11/03/2016, 12:04 PM

## 2016-11-03 NOTE — Care Management Obs Status (Signed)
[place]   Patient Details  Name: MANLEY TUNKS MRN: [id number] Date of Birth: 02/21/1923   Medicare Observation Status Notification Given:  Yes    Dawayne Patricia, RN 11/03/2016, 4:08 PM

## 2016-11-03 NOTE — Progress Notes (Signed)
Inpatient Diabetes Program Recommendations  AACE/ADA: New Consensus Statement on Inpatient Glycemic Control (2015)  Target Ranges:  Prepandial:   less than 140 mg/dL      Peak postprandial:   less than 180 mg/dL (1-2 hours)      Critically ill patients:  140 - 180 mg/dL   Lab Results  Component Value Date   GLUCAP 263 (H) 11/03/2016    Review of Glycemic Control  Diabetes history: DM2 Outpatient Diabetes medications: 70/30 insulin 30 units am +6 units pm Current orders for Inpatient glycemic control: Novolog correction 0-9 units tid  Inpatient Diabetes Program Recommendations:  Noted last A1c from [facility] was 13.5 on 08/12/16. Please consider: -Adding Lantus 10 units -A1c to determine prehospital glycemic control -May need Novolog 2-3 units meal coverage if eats 50% meals.  Thank you, Nani Gasser. Jerimy Johanson, RN, MSN, CDE Inpatient Glycemic Control Team Team Pager [phone] (8am-5pm) 11/03/2016 9:02 AM

## 2016-11-04 DIAGNOSIS — R0602 Shortness of breath: Secondary | ICD-10-CM

## 2016-11-04 DIAGNOSIS — E785 Hyperlipidemia, unspecified: Secondary | ICD-10-CM

## 2016-11-04 LAB — CBC WITH DIFFERENTIAL/PLATELET
BASOS ABS: 0 10*3/uL (ref 0.0–0.1)
Basophils Relative: 0 %
EOS PCT: 3 %
Eosinophils Absolute: 0.2 10*3/uL (ref 0.0–0.7)
HEMATOCRIT: 31.6 % — AB (ref 39.0–52.0)
Hemoglobin: 10.1 g/dL — ABNORMAL LOW (ref 13.0–17.0)
LYMPHS ABS: 1 10*3/uL (ref 0.7–4.0)
LYMPHS PCT: 15 %
MCH: 24.6 pg — AB (ref 26.0–34.0)
MCHC: 32 g/dL (ref 30.0–36.0)
MCV: 77.1 fL — AB (ref 78.0–100.0)
MONO ABS: 1.2 10*3/uL — AB (ref 0.1–1.0)
MONOS PCT: 17 %
NEUTROS ABS: 4.4 10*3/uL (ref 1.7–7.7)
Neutrophils Relative %: 65 %
Platelets: 214 10*3/uL (ref 150–400)
RBC: 4.1 MIL/uL — ABNORMAL LOW (ref 4.22–5.81)
RDW: 18.7 % — AB (ref 11.5–15.5)
WBC: 6.8 10*3/uL (ref 4.0–10.5)

## 2016-11-04 LAB — GLUCOSE, CAPILLARY
GLUCOSE-CAPILLARY: 244 mg/dL — AB (ref 65–99)
GLUCOSE-CAPILLARY: 260 mg/dL — AB (ref 65–99)
Glucose-Capillary: 362 mg/dL — ABNORMAL HIGH (ref 65–99)
Glucose-Capillary: 254 mg/dL — ABNORMAL HIGH (ref 65–99)

## 2016-11-04 LAB — BASIC METABOLIC PANEL
Anion gap: 8 (ref 5–15)
BUN: 36 mg/dL — AB (ref 6–20)
CHLORIDE: 94 mmol/L — AB (ref 101–111)
CO2: 26 mmol/L (ref 22–32)
CREATININE: 1.56 mg/dL — AB (ref 0.61–1.24)
Calcium: 8.9 mg/dL (ref 8.9–10.3)
GFR calc Af Amer: 43 mL/min — ABNORMAL LOW (ref >60)
GFR calc non Af Amer: 37 mL/min — ABNORMAL LOW (ref >60)
GLUCOSE: 248 mg/dL — AB (ref 65–99)
POTASSIUM: 4.8 mmol/L (ref 3.5–5.1)
Sodium: 128 mmol/L — ABNORMAL LOW (ref 135–145)

## 2016-11-04 NOTE — Progress Notes (Signed)
PROGRESS NOTE    Rodney [name]  [id number] DOB: December 30, 1922 DOA: 11/02/2016 PCP: Mitzie Na, MD   Brief Narrative:  Rodney [name] is a 82 y.o.malewith medical history significant of chronic systolic CHF, paroxysmal atrial fibrillation, diabetes mellitus, hypertension, hyperlipidemia, chronic kidney disease stage III, prior nephrolithiasis presents to the emergency room with chief complaint of left flank and back pain. He was admitted with concerns for UTI / Pyelopnephritis as well as Acute Decompensation of Chronic Systolic CHF. Patient is currently being diuresed and improving and states his back pain is also improving.   Assessment & Plan:   Active Problems:   Pyelonephritis   Acute on chronic systolic CHF (congestive heart failure) (HCC)   DM (diabetes mellitus) (HCC)   Atrial fibrillation (HCC)   HTN (hypertension)   HLD (hyperlipidemia)   ICD (implantable cardioverter-defibrillator) in place   CKD (chronic kidney disease) stage 3, GFR 30-59 ml/min   Hyponatremia   CAD (coronary artery disease)   Shortness of breath  Concern for Pyelonephritis UTI / Flank pain - Urine cultures and showed Multiple Species Present, however he has been on ciprofloxacin and cultures may remain negative.  - C/w IV Ceftriaxone for now (day 3). Improving - C/w Fluconazole for Yeast in UTI and Nystatin Topical  - Patient has CVA tenderness on exam still but much improved. Pain may be superimposed  MSKpain  Acute Decompensation of Chronic Systolic CHF with last EF of 30% - BNP on Admission was 1,885.7 - Most recent 2-D echo was done at [facility] in June 2017 and showed ejection fraction of 30%. - Will not repeat a 2-D echo this visit - C/w Carvedilol 3.125 mg po BID, C/w Spironolactone 25 mg po Daily - Continue IV Lasix 40 mg Daily - Strict I's and O's, Daily Weights, Fluid Restriction - Patient's Weight is down from 177 >> 171 -Total Output since admit is -3.5 Liters  Atrial  Fibrillation - Patient's CHA2DS2-VASc Score for Stroke Risk is at least 5. - He has been on Coumadin at one point in the past, however had what seems like a GI bleed and he was taken off of anticoagulation. Continue aspirin. - Continue amiodarone 200 mg po Daily as well as Carvedilol 3.125 mg po BID  Diabetes Mellitus Type 2 - Place patient on Sensitive Novolog SSI; May increase to Moderate SSI - C/w Lantus 7 units sq daily - CBG's have been ranging from 244 - 362  Acute kidney injury on chronic kidney disease stage III - Baseline creatinine around 1.3-1.4based on [facility] records, it hasincreased to 2.1 on admission likely multifactorial in the setting of diabetes, hypertension,? Component of cardiorenal syndrome given long-standing CHF.  - BUN/Cr went from 40/1.77 -> 37/1.56 - Repeat BMP in AM - C/w IV Diuresis 40 mg Daily and likely switch to po in AM  Fall -PT Recommends SNF  Coronary Artery Disease -Patient had a recent evaluation in June 2017 at [facility] with a myocardial perfusion scan, without any reversible perfusion defects. Has no chest pain.  - Continue Aspirin, Beta blocker and statin - Slight troponin elevation likely due to Heart Failure Exacerbation, cardiac enzymes flat (0.14 -> 0.12 -> 0.10)  Likely Hypervolemic Hyponatremia - Clinically appears fluid overloaded, given IV Lasix and monitor sodium levels. - Sodium was 128 today and down from 130 yesterday   Hypertension -Decreased Coreg dose was receiving IV Lasix; had 1 soft blood pressure of 90 systolic in the ER, asymptomatic. -Continue to Monitor VS   Hyperlipidemia -  Continue Pravastatin 40 mg po Daily  GERD - C/w Pantoprazole 40 mg po Daily  DVT prophylaxis: Heparin sq 5000 Units Code Status: FULL Family Communication: No Family at Bedside Disposition Plan: SNF  Consultants:   None  Procedures:  None  Antimicrobials: IV Ceftriaxone 11/02/2016 -->  Subjective: Seen and examined at bedside  and stated his pain in his back was improving and so was his Left knee pain. Stated he wasn't as SOB today. No N/V/Abdominal Pain or complaints of Chest Pain. No other concerns or complaints.   Objective: Vitals:   11/03/16 2113 11/04/16 0500 11/04/16 0926 11/04/16 1308  BP: (!) 96/53 117/66 (!) 97/55 104/65  Pulse: 69 71 70 75  Resp: 18 18  17   Temp: 98.1 F (36.7 C) 98.1 F (36.7 C)  97.5 F (36.4 C)  TempSrc: Oral Oral  Oral  SpO2: 98% (!) 84%  100%  Weight:  77.7 kg (171 lb 3.2 oz)    Height:        Intake/Output Summary (Last 24 hours) at 11/04/16 1917 Last data filed at 11/04/16 1804  Gross per 24 hour  Intake              820 ml  Output             2200 ml  Net            -1380 ml   Filed Weights   11/02/16 2126 11/03/16 0522 11/04/16 0500  Weight: 80.4 kg (177 lb 4.8 oz) 79.4 kg (175 lb 1.6 oz) 77.7 kg (171 lb 3.2 oz)    Examination: Physical Exam:  Constitutional: WN/WD, NAD and appears calm and comfortable Eyes: PERRL, Left Eye bruising noted, sclerae anicteric  ENMT: External Ears, Nose appear normal. Grossly normal hearing. Mucous membranes are moist. Posterior pharynx clear of any exudate or lesions. Normal dentition.  Neck: Appears normal, supple, no cervical masses, normal ROM, no appreciable thyromegaly, Mild JVD.  Respiratory: Diminished to auscultation bilaterally, no wheezing, rales, rhonchi or crackles. Normal respiratory effort and patient is not tachypenic. No accessory muscle use.  Cardiovascular: RRR, no murmurs / rubs / gallops. S1 and S2 auscultated. 1+ extremity edema.  Abdomen: Soft, non-tender, non-distended. No masses palpated. No appreciable hepatosplenomegaly. Bowel sounds positive.  GU: Deferred. Musculoskeletal: No clubbing / cyanosis of digits/nails. No joint deformity upper and lower extremities.  Skin: No rashes, lesions, ulcers. No induration; Warm and dry. Patient has bruising on Left eye, Right had has some abrasions.  Neurologic: CN  2-12 grossly intact with no focal deficits. Sensation intact in all 4 Extremities. Romberg sign cerebellar reflexes not assessed.  Psychiatric: Normal judgment and insight. Alert and oriented x 3. Anxious mood and appropriate affect.   Data Reviewed: I have personally reviewed following labs and imaging studies  CBC:  Recent Labs Lab 11/02/16 1314 11/03/16 0458 11/04/16 1043  WBC 10.1 8.1 6.8  NEUTROABS  --   --  4.4  HGB 11.2* 10.2* 10.1*  HCT 35.3* 32.2* 31.6*  MCV 76.2* 76.3* 77.1*  PLT 235 219 [id number]   Basic Metabolic Panel:  Recent Labs Lab 11/02/16 1314 11/03/16 0458 11/04/16 0204  NA 129* 130* 128*  K 4.7 4.8 4.8  CL 95* 96* 94*  CO2 23 27 26   GLUCOSE 169* 245* 248*  BUN 50* 40* 36*  CREATININE 2.10* 1.77* 1.56*  CALCIUM 9.2 8.8* 8.9   GFR: Estimated Creatinine Clearance: 28.8 mL/min (by C-G formula based on SCr of 1.56 mg/dL (H)). Liver  Function Tests:  Recent Labs Lab 11/03/16 0458  AST 25  ALT 22  ALKPHOS 122  BILITOT 1.3*  PROT 6.4*  ALBUMIN 3.4*   No results for input(s): LIPASE, AMYLASE in the last 168 hours. No results for input(s): AMMONIA in the last 168 hours. Coagulation Profile: No results for input(s): INR, PROTIME in the last 168 hours. Cardiac Enzymes:  Recent Labs Lab 11/02/16 1656 11/02/16 2234 11/03/16 0458  TROPONINI 0.14* 0.12* 0.10*   BNP (last 3 results) No results for input(s): PROBNP in the last 8760 hours. HbA1C: No results for input(s): HGBA1C in the last 72 hours. CBG:  Recent Labs Lab 11/03/16 1653 11/03/16 2106 11/04/16 0648 11/04/16 1119 11/04/16 1638  GLUCAP 260* 264* 244* 362* 254*   Lipid Profile: No results for input(s): CHOL, HDL, LDLCALC, TRIG, CHOLHDL, LDLDIRECT in the last 72 hours. Thyroid Function Tests:  Recent Labs  11/03/16 0458  TSH 8.173*   Anemia Panel: No results for input(s): VITAMINB12, FOLATE, FERRITIN, TIBC, IRON, RETICCTPCT in the last 72 hours. Sepsis Labs: No results for  input(s): PROCALCITON, LATICACIDVEN in the last 168 hours.  Recent Results (from the past 240 hour(s))  Urine culture     Status: Abnormal   Collection Time: 11/02/16 10:27 AM  Result Value Ref Range Status   Specimen Description URINE, RANDOM  Final   Special Requests NONE  Final   Culture MULTIPLE SPECIES PRESENT, SUGGEST RECOLLECTION (A)  Final   Report Status 11/03/2016 FINAL  Final  Culture, Urine     Status: Abnormal   Collection Time: 11/02/16  7:08 PM  Result Value Ref Range Status   Specimen Description URINE, CLEAN CATCH  Final   Special Requests NONE  Final   Culture MULTIPLE SPECIES PRESENT, SUGGEST RECOLLECTION (A)  Final   Report Status 11/03/2016 FINAL  Final    Radiology Studies: No results found.   Scheduled Meds: . amiodarone  200 mg Oral Daily  . aspirin  81 mg Oral Daily  . carvedilol  3.125 mg Oral BID WC  . cefTRIAXone (ROCEPHIN)  IV  1 g Intravenous Q24H  . fluconazole  200 mg Oral Daily  . furosemide  40 mg Intravenous Daily  . heparin  5,000 Units Subcutaneous Q8H  . insulin aspart  0-9 Units Subcutaneous TID WC  . insulin glargine  7 Units Subcutaneous Daily  . nystatin   Topical BID  . pantoprazole  20 mg Oral Daily  . polyethylene glycol  17 g Oral Daily  . pravastatin  40 mg Oral q1800  . sodium chloride flush  3 mL Intravenous Q12H  . spironolactone  25 mg Oral Daily  . tamsulosin  0.4 mg Oral QPC supper   Continuous Infusions:   LOS: 2 days   Kerney Elbe, DO [facility] Pager [phone]  If 7PM-7AM, please contact night-coverage [email or web address] Password [email or web address] 11/04/2016, 7:17 PM

## 2016-11-04 NOTE — Evaluation (Signed)
Physical Therapy Evaluation Patient Details Name: Rodney [name] MRN: [id number] DOB: Apr 10, 1923 Today's Date: 11/04/2016   History of Present Illness  pt is a 82 y/o male with PMH of CHF, AF, DM, HTN, CKD III, and multiple falls admitted with pylonephritis.   Clinical Impression  Pt presents with decreased independence with all aspects of functional mobility 2/2 pain and decreased strength.  Pt reports several incidents of knees giving out at home and had a fall immediately prior to this admission.  As patient lives alone, recommend f/u at sub-acute rehab to maximize independence before returning home alone.     Follow Up Recommendations SNF;Supervision/Assistance - 24 hour    Equipment Recommendations  None recommended by PT    Recommendations for Other Services OT consult     Precautions / Restrictions Precautions Precautions: Fall Restrictions Weight Bearing Restrictions: No      Mobility  Bed Mobility               General bed mobility comments: pt in chair on arrival  Transfers Overall transfer level: Needs assistance Equipment used: Rolling walker (2 wheeled) Transfers: Sit to/from Stand Sit to Stand: Mod assist         General transfer comment: pt requires cues for hand placement and forward weight shift as well as lifting assistance to stand  Ambulation/Gait Ambulation/Gait assistance:  (declined)              Stairs            Wheelchair Mobility    Modified Rankin (Stroke Patients Only)       Balance Overall balance assessment: Needs assistance         Standing balance support: Bilateral upper extremity supported Standing balance-Leahy Scale: Poor                               Pertinent Vitals/Pain Pain Assessment: Faces Faces Pain Scale: Hurts even more Pain Intervention(s): Monitored during session;Repositioned    Home Living Family/patient expects to be discharged to:: Private residence Living  Arrangements: Alone Available Help at Discharge: Family;Available PRN/intermittently Type of Home: House Home Access: Stairs to enter Entrance Stairs-Rails: Can reach both Entrance Stairs-Number of Steps: 2-3 Home Layout: One level        Prior Function Level of Independence: Independent with assistive device(s)         Comments: uses rollator for all mobility      Hand Dominance        Extremity/Trunk Assessment   Upper Extremity Assessment: Defer to OT evaluation           Lower Extremity Assessment:  (Pt moving BLEs against gravity and able to stand with assist)      Cervical / Trunk Assessment: Other exceptions (forward head)  Communication   Communication: No difficulties  Cognition Arousal/Alertness: Awake/alert Behavior During Therapy: Restless Overall Cognitive Status: Within Functional Limits for tasks assessed                      General Comments      Exercises     Assessment/Plan    PT Assessment Patient needs continued PT services  PT Problem List Decreased strength;Decreased range of motion;Decreased activity tolerance;Decreased balance;Decreased mobility;Decreased coordination;Decreased cognition;Decreased knowledge of use of DME;Decreased safety awareness;Cardiopulmonary status limiting activity;Pain          PT Treatment Interventions DME instruction;Gait training;Stair training;Functional mobility training;Therapeutic activities;Therapeutic exercise;Balance training;Neuromuscular  re-education    PT Goals (Current goals can be found in the Care Plan section)  Acute Rehab PT Goals Patient Stated Goal: Pt wants to be able to walk without pain PT Goal Formulation: With patient Time For Goal Achievement: 11/11/16 Potential to Achieve Goals: Good    Frequency Min 3X/week   Barriers to discharge Inaccessible home environment;Decreased caregiver support pt lives alone and reports multiple falls prior to admission     Co-evaluation               End of Session Equipment Utilized During Treatment: Gait belt Activity Tolerance: Patient limited by fatigue;Patient limited by pain Patient left: in chair;with call bell/phone within reach Nurse Communication: Mobility status;Other (comment) (recommendation for sub acute rehab)         Time: [id number] PT Time Calculation (min) (ACUTE ONLY): 25 min   Charges:   PT Evaluation $PT Eval Low Complexity: 1 Procedure PT Treatments $Therapeutic Activity: 8-22 mins   PT G Codes:        [place] Laser E Penven-Crew 11/04/2016, 3:25 PM

## 2016-11-05 LAB — PHOSPHORUS: Phosphorus: 3 mg/dL (ref 2.5–4.6)

## 2016-11-05 LAB — COMPREHENSIVE METABOLIC PANEL
ALK PHOS: 119 U/L (ref 38–126)
ALT: 17 U/L (ref 17–63)
AST: 19 U/L (ref 15–41)
Albumin: 3.3 g/dL — ABNORMAL LOW (ref 3.5–5.0)
Anion gap: 10 (ref 5–15)
BUN: 27 mg/dL — AB (ref 6–20)
CALCIUM: 8.8 mg/dL — AB (ref 8.9–10.3)
CHLORIDE: 91 mmol/L — AB (ref 101–111)
CO2: 25 mmol/L (ref 22–32)
CREATININE: 1.29 mg/dL — AB (ref 0.61–1.24)
GFR, EST AFRICAN AMERICAN: 54 mL/min — AB (ref >60)
GFR, EST NON AFRICAN AMERICAN: 47 mL/min — AB (ref >60)
Glucose, Bld: 234 mg/dL — ABNORMAL HIGH (ref 65–99)
Potassium: 5.1 mmol/L (ref 3.5–5.1)
Sodium: 126 mmol/L — ABNORMAL LOW (ref 135–145)
Total Bilirubin: 0.9 mg/dL (ref 0.3–1.2)
Total Protein: 6.6 g/dL (ref 6.5–8.1)

## 2016-11-05 LAB — CBC WITH DIFFERENTIAL/PLATELET
BASOS ABS: 0 10*3/uL (ref 0.0–0.1)
Basophils Relative: 0 %
EOS PCT: 4 %
Eosinophils Absolute: 0.3 10*3/uL (ref 0.0–0.7)
HEMATOCRIT: 32.8 % — AB (ref 39.0–52.0)
HEMOGLOBIN: 10.1 g/dL — AB (ref 13.0–17.0)
LYMPHS ABS: 1.1 10*3/uL (ref 0.7–4.0)
LYMPHS PCT: 15 %
MCH: 23.8 pg — AB (ref 26.0–34.0)
MCHC: 30.8 g/dL (ref 30.0–36.0)
MCV: 77.4 fL — AB (ref 78.0–100.0)
Monocytes Absolute: 1 10*3/uL (ref 0.1–1.0)
Monocytes Relative: 13 %
NEUTROS ABS: 5.4 10*3/uL (ref 1.7–7.7)
NEUTROS PCT: 68 %
PLATELETS: 229 10*3/uL (ref 150–400)
RBC: 4.24 MIL/uL (ref 4.22–5.81)
RDW: 18.6 % — ABNORMAL HIGH (ref 11.5–15.5)
WBC: 7.8 10*3/uL (ref 4.0–10.5)

## 2016-11-05 LAB — BASIC METABOLIC PANEL
ANION GAP: 11 (ref 5–15)
BUN: 28 mg/dL — ABNORMAL HIGH (ref 6–20)
CALCIUM: 8.8 mg/dL — AB (ref 8.9–10.3)
CO2: 23 mmol/L (ref 22–32)
CREATININE: 1.49 mg/dL — AB (ref 0.61–1.24)
Chloride: 88 mmol/L — ABNORMAL LOW (ref 101–111)
GFR calc non Af Amer: 39 mL/min — ABNORMAL LOW (ref >60)
GFR, EST AFRICAN AMERICAN: 45 mL/min — AB (ref >60)
Glucose, Bld: 343 mg/dL — ABNORMAL HIGH (ref 65–99)
Potassium: 5.3 mmol/L — ABNORMAL HIGH (ref 3.5–5.1)
SODIUM: 122 mmol/L — AB (ref 135–145)

## 2016-11-05 LAB — GLUCOSE, CAPILLARY
GLUCOSE-CAPILLARY: 340 mg/dL — AB (ref 65–99)
Glucose-Capillary: 207 mg/dL — ABNORMAL HIGH (ref 65–99)
Glucose-Capillary: 300 mg/dL — ABNORMAL HIGH (ref 65–99)
Glucose-Capillary: 253 mg/dL — ABNORMAL HIGH (ref 65–99)

## 2016-11-05 LAB — OSMOLALITY, URINE: OSMOLALITY UR: 252 mosm/kg — AB (ref 300–900)

## 2016-11-05 LAB — SODIUM, URINE, RANDOM: SODIUM UR: 59 mmol/L

## 2016-11-05 LAB — MAGNESIUM: MAGNESIUM: 2 mg/dL (ref 1.7–2.4)

## 2016-11-05 LAB — OSMOLALITY: OSMOLALITY: 290 mosm/kg (ref 275–295)

## 2016-11-05 MED ORDER — FUROSEMIDE 10 MG/ML IJ SOLN
40.0000 mg | Freq: Two times a day (BID) | INTRAMUSCULAR | Status: DC
  Administered 2016-11-05 – 2016-11-07 (×4): 40 mg via INTRAVENOUS
  Filled 2016-11-05 (×4): qty 4

## 2016-11-05 MED ORDER — OXYCODONE-ACETAMINOPHEN 5-325 MG PO TABS
1.0000 | ORAL_TABLET | Freq: Once | ORAL | Status: AC
  Administered 2016-11-05: 1 via ORAL
  Filled 2016-11-05: qty 1

## 2016-11-05 NOTE — Progress Notes (Signed)
Pharmacist Heart Failure Core Measure Documentation  Assessment: L[facility] SUR has an EF documented as 30% on 05/28/2016 (at [facility]) by 2D Echo.  Rationale: Heart failure patients with left ventricular systolic dysfunction (LVSD) and an EF < 40% should be prescribed an angiotensin converting enzyme inhibitor (ACEI) or angiotensin receptor blocker (ARB) at discharge unless a contraindication is documented in the medical record.  This patient is not currently on an ACEI or ARB for HF.  This note is being placed in the record in order to provide documentation that a contraindication to the use of these agents is present for this encounter.  ACE Inhibitor or Angiotensin Receptor Blocker is contraindicated (specify all that apply)  []   ACEI allergy AND ARB allergy []   Angioedema []   Moderate or severe aortic stenosis []   Hyperkalemia []   Hypotension []   Renal artery stenosis [x]   Worsening renal function, preexisting renal disease or dysfunction   Norva Riffle 11/05/2016 3:19 PM

## 2016-11-05 NOTE — Progress Notes (Signed)
PROGRESS NOTE    [name]  [id number] DOB: 06-06-1923 DOA: 11/02/2016 PCP: Mitzie Na, MD   Brief Narrative:  Rodney [name] is a 82 y.o.malewith medical history significant of chronic systolic CHF, paroxysmal atrial fibrillation, diabetes mellitus, hypertension, hyperlipidemia, chronic kidney disease stage III, prior nephrolithiasis presents to the emergency room with chief complaint of left flank and back pain. He was admitted with concerns for UTI / Pyelopnephritis as well as Acute Decompensation of Chronic Systolic CHF. Patient is currently being diuresed and improving and states his back pain is also improving. Discussed with Social Worker today and she is going to discuss placement options with the patient.   Assessment & Plan:   Active Problems:   Pyelonephritis   Acute on chronic systolic CHF (congestive heart failure) (HCC)   DM (diabetes mellitus) ([place])   Atrial fibrillation (HCC)   HTN (hypertension)   HLD (hyperlipidemia)   ICD (implantable cardioverter-defibrillator) in place   CKD (chronic kidney disease) stage 3, GFR 30-59 ml/min   Hyponatremia   CAD (coronary artery disease)   Shortness of breath  Acute Decompensation of Chronic Systolic CHF with last EF of 30% - BNP on Admission was 1,885.7 - Most recent 2-D echo was done at [facility] in June 2017 and showed ejection fraction of 30%. - Will not repeat a 2-D echo this visit - C/w Carvedilol 3.125 mg po BID, C/w Spironolactone 25 mg po Daily - Increased IV Lasix 40 mg Daily to 40 mg BID - Strict I's and O's, Daily Weights, Fluid Restriction - Patient's Weight went from 177 >> 171 >> 174 lbs - Total Output since admit is -4.8 Liters  Concern for Pyelonephritis UTI / Flank pain - Urine cultures and showed Multiple Species Present, however he has been on ciprofloxacin and cultures may remain negative.  - C/w IV Ceftriaxone for now (day 4). Improving - C/w Fluconazole for Yeast in UTI and  Nystatin Topical  - Patient has CVA tenderness on exam still but much improved. Pain may be superimposed  MSKpain - States back Pain is still there but not as bad.   Likely Hypervolemic Hyponatremia - Clinically appears fluid overloaded, increased IV Lasix and monitor sodium levels. - Will obtain Urine Osmolality, Urine Sodium, Urine Urea Nitrogen - Sodium was 126 today and down from 128 yesterday  - Continue to Monitor BMP's. Repeat at 1500 today pending.   Atrial Fibrillation - Patient's CHA2DS2-VASc Score for Stroke Risk is at least 5. - TSH was 8.173 - He has been on Coumadin at one point in the past, however had what seems like a GI bleed and he was taken off of anticoagulation. Continue aspirin. - Continue amiodarone 200 mg po Daily as well as Carvedilol 3.125 mg po BID  Diabetes Mellitus Type 2 - Place patient on Sensitive Novolog SSI; May increase to Moderate SSI - C/w Lantus 7 units sq daily - CBG's have been ranging from 244 - 362  Acute kidney injury on chronic kidney disease stage III - Baseline creatinine around 1.3-1.4based on [facility] records, it hasincreased to 2.1 on admission likely multifactorial in the setting of diabetes, hypertension,? Component of cardiorenal syndrome given long-standing CHF.  - BUN/Cr went from 40/1.77 -> 37/1.56 -> 27/1.29 - Repeat BMP in AM - C/w IV Diuresis 40 mg Daily and likely switch to po in AM  Fall -PT Recommends SNF; Social Work [employer] for [employer].   Coronary Artery Disease -Patient had a recent evaluation in June 2017 at [facility]  with a myocardial perfusion scan, without any reversible perfusion defects. Has no chest pain.  - Continue Aspirin, Beta blocker and statin - Slight troponin elevation likely due to Heart Failure Exacerbation, cardiac enzymes flat (0.14 -> 0.12 -> 0.10)  Hypertension -Decreased Coreg dose was receiving IV Lasix; had 1 soft blood pressure of 90 systolic in the ER, asymptomatic. -Continue to  Monitor VS   Hyperlipidemia - Continue Pravastatin 40 mg po Daily  GERD - C/w Pantoprazole 40 mg po Daily  DVT prophylaxis: Heparin sq 5000 Units Code Status: FULL Family Communication: No Family at Bedside Disposition Plan: SNF; Social Work to visit with the patient.   Consultants:   None  Procedures:  None  Antimicrobials: IV Ceftriaxone 11/02/2016 -->  Subjective: Seen and examined at bedside and stated his pain in his back was still there and he thinks that his leg swelling was a little worse. States he thinks that Lasix makes his breathing worse. Patient had mild Right Chest Wall tenderness from fall. No N/V/ Abdominal Pain. No other concerns or complaints.   Objective: Vitals:   11/05/16 0500 11/05/16 0515 11/05/16 0826 11/05/16 1353  BP:  121/71 129/65 109/63  Pulse:  71 70 70  Resp:  16  18  Temp:  97.7 F (36.5 C)  98 F (36.7 C)  TempSrc:  Oral  Oral  SpO2:  96%  98%  Weight: 79 kg (174 lb 3.2 oz)     Height:        Intake/Output Summary (Last 24 hours) at 11/05/16 1506 Last data filed at 11/05/16 1128  Gross per 24 hour  Intake              243 ml  Output             2200 ml  Net            -1957 ml   Filed Weights   11/03/16 0522 11/04/16 0500 11/05/16 0500  Weight: 79.4 kg (175 lb 1.6 oz) 77.7 kg (171 lb 3.2 oz) 79 kg (174 lb 3.2 oz)    Examination: Physical Exam:  Constitutional: WN/WD, NAD and appears calm and comfortable Eyes: PERRL, Left Eye bruising noted, sclerae anicteric  ENMT: External Ears, Nose appear normal. Grossly normal hearing. Mucous membranes are moist. Posterior pharynx clear of any exudate or lesions. Normal dentition.  Neck: Appears normal, supple, no cervical masses, normal ROM, no appreciable thyromegaly, Mild JVD.  Chest Wall: Patient had tenderness on Right side from fall.  Respiratory: Diminished to auscultation bilaterally, no wheezing, rales, rhonchi or crackles. Normal respiratory effort and patient is not  tachypenic. No accessory muscle use.  Cardiovascular: RRR, no murmurs / rubs / gallops. S1 and S2 auscultated. 1+ extremity edema.  Abdomen: Soft, non-tender, non-distended. No masses palpated. No appreciable hepatosplenomegaly. Bowel sounds positive.  GU: Deferred. Musculoskeletal: No clubbing / cyanosis of digits/nails. No joint deformity upper and lower extremities.  Skin: No rashes, lesions, ulcers. No induration; Warm and dry. Patient has bruising on Left eye, Right had has some abrasions.  Neurologic: CN 2-12 grossly intact with no focal deficits. Sensation intact in all 4 Extremities. Romberg sign cerebellar reflexes not assessed.  Psychiatric: Normal judgment and insight. Alert and oriented x 3. Anxious mood and appropriate affect.   Data Reviewed: I have personally reviewed following labs and imaging studies  CBC:  Recent Labs Lab 11/02/16 1314 11/03/16 0458 11/04/16 1043 11/05/16 0252  WBC 10.1 8.1 6.8 7.8  NEUTROABS  --   --  4.4 5.4  HGB 11.2* 10.2* 10.1* 10.1*  HCT 35.3* 32.2* 31.6* 32.8*  MCV 76.2* 76.3* 77.1* 77.4*  PLT 235 219 214 [id number]   Basic Metabolic Panel:  Recent Labs Lab 11/02/16 1314 11/03/16 0458 11/04/16 0204 11/05/16 0252  NA 129* 130* 128* 126*  K 4.7 4.8 4.8 5.1  CL 95* 96* 94* 91*  CO2 23 27 26 25   GLUCOSE 169* 245* 248* 234*  BUN 50* 40* 36* 27*  CREATININE 2.10* 1.77* 1.56* 1.29*  CALCIUM 9.2 8.8* 8.9 8.8*  MG  --   --   --  2.0  PHOS  --   --   --  3.0   GFR: Estimated Creatinine Clearance: 34.9 mL/min (by C-G formula based on SCr of 1.29 mg/dL (H)). Liver Function Tests:  Recent Labs Lab 11/03/16 0458 11/05/16 0252  AST 25 19  ALT 22 17  ALKPHOS 122 119  BILITOT 1.3* 0.9  PROT 6.4* 6.6  ALBUMIN 3.4* 3.3*   No results for input(s): LIPASE, AMYLASE in the last 168 hours. No results for input(s): AMMONIA in the last 168 hours. Coagulation Profile: No results for input(s): INR, PROTIME in the last 168 hours. Cardiac  Enzymes:  Recent Labs Lab 11/02/16 1656 11/02/16 2234 11/03/16 0458  TROPONINI 0.14* 0.12* 0.10*   BNP (last 3 results) No results for input(s): PROBNP in the last 8760 hours. HbA1C: No results for input(s): HGBA1C in the last 72 hours. CBG:  Recent Labs Lab 11/04/16 1119 11/04/16 1638 11/04/16 2044 11/05/16 0632 11/05/16 1129  GLUCAP 362* 254* 260* 207* 340*   Lipid Profile: No results for input(s): CHOL, HDL, LDLCALC, TRIG, CHOLHDL, LDLDIRECT in the last 72 hours. Thyroid Function Tests:  Recent Labs  11/03/16 0458  TSH 8.173*   Anemia Panel: No results for input(s): VITAMINB12, FOLATE, FERRITIN, TIBC, IRON, RETICCTPCT in the last 72 hours. Sepsis Labs: No results for input(s): PROCALCITON, LATICACIDVEN in the last 168 hours.  Recent Results (from the past 240 hour(s))  Urine culture     Status: Abnormal   Collection Time: 11/02/16 10:27 AM  Result Value Ref Range Status   Specimen Description URINE, RANDOM  Final   Special Requests NONE  Final   Culture MULTIPLE SPECIES PRESENT, SUGGEST RECOLLECTION (A)  Final   Report Status 11/03/2016 FINAL  Final  Culture, Urine     Status: Abnormal   Collection Time: 11/02/16  7:08 PM  Result Value Ref Range Status   Specimen Description URINE, CLEAN CATCH  Final   Special Requests NONE  Final   Culture MULTIPLE SPECIES PRESENT, SUGGEST RECOLLECTION (A)  Final   Report Status 11/03/2016 FINAL  Final    Radiology Studies: No results found.   Scheduled Meds: . amiodarone  200 mg Oral Daily  . aspirin  81 mg Oral Daily  . carvedilol  3.125 mg Oral BID WC  . cefTRIAXone (ROCEPHIN)  IV  1 g Intravenous Q24H  . fluconazole  200 mg Oral Daily  . furosemide  40 mg Intravenous BID  . heparin  5,000 Units Subcutaneous Q8H  . insulin aspart  0-9 Units Subcutaneous TID WC  . insulin glargine  7 Units Subcutaneous Daily  . nystatin   Topical BID  . pantoprazole  20 mg Oral Daily  . polyethylene glycol  17 g Oral Daily   . pravastatin  40 mg Oral q1800  . sodium chloride flush  3 mL Intravenous Q12H  . spironolactone  25 mg Oral Daily  . tamsulosin  0.4 mg Oral QPC supper   Continuous Infusions:   LOS: 3 days   Kerney Elbe, DO [facility] Pager [phone]  If 7PM-7AM, please contact night-coverage [email or web address] Password [email or web address] 11/05/2016, 3:06 PM

## 2016-11-06 ENCOUNTER — Inpatient Hospital Stay (HOSPITAL_COMMUNITY): Payer: Medicare Other

## 2016-11-06 LAB — COMPREHENSIVE METABOLIC PANEL
ALBUMIN: 3.1 g/dL — AB (ref 3.5–5.0)
ALK PHOS: 118 U/L (ref 38–126)
ALT: 15 U/L — AB (ref 17–63)
ANION GAP: 10 (ref 5–15)
AST: 19 U/L (ref 15–41)
BILIRUBIN TOTAL: 0.7 mg/dL (ref 0.3–1.2)
BUN: 26 mg/dL — ABNORMAL HIGH (ref 6–20)
CALCIUM: 8.8 mg/dL — AB (ref 8.9–10.3)
CO2: 25 mmol/L (ref 22–32)
CREATININE: 1.37 mg/dL — AB (ref 0.61–1.24)
Chloride: 90 mmol/L — ABNORMAL LOW (ref 101–111)
GFR calc non Af Amer: 43 mL/min — ABNORMAL LOW (ref >60)
GFR, EST AFRICAN AMERICAN: 50 mL/min — AB (ref >60)
GLUCOSE: 181 mg/dL — AB (ref 65–99)
Potassium: 4.4 mmol/L (ref 3.5–5.1)
SODIUM: 125 mmol/L — AB (ref 135–145)
TOTAL PROTEIN: 6.7 g/dL (ref 6.5–8.1)

## 2016-11-06 LAB — CBC WITH DIFFERENTIAL/PLATELET
Basophils Absolute: 0 10*3/uL (ref 0.0–0.1)
Basophils Relative: 0 %
EOS ABS: 0.3 10*3/uL (ref 0.0–0.7)
Eosinophils Relative: 3 %
HEMATOCRIT: 30 % — AB (ref 39.0–52.0)
HEMOGLOBIN: 9.4 g/dL — AB (ref 13.0–17.0)
LYMPHS ABS: 1.8 10*3/uL (ref 0.7–4.0)
Lymphocytes Relative: 24 %
MCH: 23.8 pg — AB (ref 26.0–34.0)
MCHC: 31.3 g/dL (ref 30.0–36.0)
MCV: 75.9 fL — ABNORMAL LOW (ref 78.0–100.0)
MONOS PCT: 11 %
Monocytes Absolute: 0.8 10*3/uL (ref 0.1–1.0)
NEUTROS ABS: 4.6 10*3/uL (ref 1.7–7.7)
NEUTROS PCT: 62 %
Platelets: 226 10*3/uL (ref 150–400)
RBC: 3.95 MIL/uL — AB (ref 4.22–5.81)
RDW: 18.3 % — ABNORMAL HIGH (ref 11.5–15.5)
WBC: 7.4 10*3/uL (ref 4.0–10.5)

## 2016-11-06 LAB — PHOSPHORUS: Phosphorus: 3.2 mg/dL (ref 2.5–4.6)

## 2016-11-06 LAB — GLUCOSE, CAPILLARY
GLUCOSE-CAPILLARY: 209 mg/dL — AB (ref 65–99)
Glucose-Capillary: 293 mg/dL — ABNORMAL HIGH (ref 65–99)
Glucose-Capillary: 341 mg/dL — ABNORMAL HIGH (ref 65–99)

## 2016-11-06 LAB — UREA NITROGEN, URINE: Urea Nitrogen, Ur: 211 mg/dL

## 2016-11-06 LAB — MAGNESIUM: Magnesium: 1.9 mg/dL (ref 1.7–2.4)

## 2016-11-06 MED ORDER — BISACODYL 10 MG RE SUPP
10.0000 mg | Freq: Once | RECTAL | Status: AC
  Administered 2016-11-06: 10 mg via RECTAL
  Filled 2016-11-06: qty 1

## 2016-11-06 MED ORDER — HYDROCOD POLST-CPM POLST ER 10-8 MG/5ML PO SUER
5.0000 mL | Freq: Once | ORAL | Status: AC
  Administered 2016-11-06: 5 mL via ORAL
  Filled 2016-11-06: qty 5

## 2016-11-06 MED ORDER — MENTHOL 3 MG MT LOZG
1.0000 | LOZENGE | OROMUCOSAL | Status: DC | PRN
  Administered 2016-11-06: 3 mg via ORAL
  Filled 2016-11-06: qty 9

## 2016-11-06 NOTE — Clinical Social Work Placement (Signed)
   CLINICAL SOCIAL WORK PLACEMENT  NOTE  Date:  11/06/2016  Patient Details  Name: Rodney [name] MRN: [id number] Date of Birth: February 25, 1923  Clinical Social Work is seeking post-discharge placement for this patient at the [place] level of care (*CSW will initial, date and re-position this form in  chart as items are completed):  Yes   Patient/family provided with [place] Work Department's list of facilities offering this level of care within the geographic area requested by the patient (or if unable, by the patient's family).  Yes   Patient/family informed of their freedom to choose among providers that offer the needed level of care, that participate in Medicare, Medicaid or managed care program needed by the patient, have an available bed and are willing to accept the patient.  No   Patient/family informed of 's ownership interest in [facility] and [facility], as well as of the fact that they are under no obligation to receive care at these facilities.  PASRR submitted to EDS on 11/06/16     PASRR number received on       Existing PASRR number confirmed on 11/06/16     FL2 transmitted to all facilities in geographic area requested by pt/family on       FL2 transmitted to all facilities within larger geographic area on 11/06/16     Patient informed that his/her managed care company has contracts with or will negotiate with certain facilities, including the following:            Patient/family informed of bed offers received.  Patient chooses bed at       Physician recommends and patient chooses bed at      Patient to be transferred to   on  .  Patient to be transferred to facility by       Patient family notified on   of transfer.  Name of family member notified:        PHYSICIAN       Additional Comment:    _______________________________________________ Serafina Mitchell, LCSWA 11/06/2016, 3:09 PM

## 2016-11-06 NOTE — NC FL2 (Signed)
[place] LEVEL OF CARE SCREENING TOOL     IDENTIFICATION  Patient Name: Rodney [name] Birthdate: Jun 14, 1923 Sex: male Admission Date (Current Location): 11/02/2016  Rodney [name] and [place] Number:  Rodney [name] and Address:  [facility], [place] [address] [place], [place] [address]      Provider Number: [id number]  Attending Physician Name and Address:  Rodney Elbe, DO  Relative Name and Phone Number:  Rodney [name] [phone]    Current Level of Care: Hospital Recommended Level of Care: [place] Prior Approval Number:    Date Approved/Denied:   PASRR Number:    Discharge Plan: SNF    Current Diagnoses: Patient Active Problem List   Diagnosis Date Noted  . Pyelonephritis 11/02/2016  . Acute on chronic systolic CHF (congestive heart failure) ([place]) 11/02/2016  . DM (diabetes mellitus) ([place]) 11/02/2016  . Atrial fibrillation ([place]) 11/02/2016  . HTN (hypertension) 11/02/2016  . HLD (hyperlipidemia) 11/02/2016  . ICD (implantable cardioverter-defibrillator) in place 11/02/2016  . CKD (chronic kidney disease) stage 3, GFR 30-59 ml/min 11/02/2016  . Hyponatremia 11/02/2016  . CAD (coronary artery disease) 11/02/2016  . Shortness of breath 11/02/2016    Orientation RESPIRATION BLADDER Height & Weight     Self, Time, Situation, Place  Normal Incontinent Weight: 176 lb 1.6 oz (79.9 kg) Height:  5\' 7"  (170.2 cm)  BEHAVIORAL SYMPTOMS/MOOD NEUROLOGICAL BOWEL NUTRITION STATUS  Other (Comment) (None)   Incontinent Diet (See DC Summary)  AMBULATORY STATUS COMMUNICATION OF NEEDS Skin   Limited Assist Verbally Normal                       Personal Care Assistance Level of Assistance  Bathing, Dressing Bathing Assistance: Limited assistance   Dressing Assistance: Limited assistance     Functional Limitations Info             SPECIAL CARE FACTORS FREQUENCY  PT (By licensed PT), OT (By  licensed OT)     PT Frequency: 5x wk OT Frequency: 5x wk            Contractures Contractures Info: Not present    Additional Factors Info  Code Status, Allergies Code Status Info: FULL Allergies Info: Tape, Sulfa Antibiotics           Current Medications (11/06/2016):  This is the current hospital active medication list Current Facility-Administered Medications  Medication Dose Route Frequency Provider Last Rate Last Dose  . acetaminophen (TYLENOL) tablet 650 mg  650 mg Oral Q6H PRN Gardiner Barefoot, NP   650 mg at 11/06/16 0202  . amiodarone (PACERONE) tablet 200 mg  200 mg Oral Daily Caren Griffins, MD   200 mg at 11/06/16 0945  . aspirin chewable tablet 81 mg  81 mg Oral Daily Caren Griffins, MD   81 mg at 11/06/16 0945  . carvedilol (COREG) tablet 3.125 mg  3.125 mg Oral BID WC Caren Griffins, MD   3.125 mg at 11/06/16 0842  . cefTRIAXone (ROCEPHIN) 1 g in dextrose 5 % 50 mL IVPB  1 g Intravenous Q24H Caren Griffins, MD 100 mL/hr at 11/05/16 1750 1 g at 11/05/16 1750  . fluconazole (DIFLUCAN) tablet 200 mg  200 mg Oral Daily Carlyle Dolly, MD   200 mg at 11/06/16 0945  . furosemide (LASIX) injection 40 mg  40 mg Intravenous BID Terry, DO   40 mg at 11/06/16 0842  .  heparin injection 5,000 Units  5,000 Units Subcutaneous Q8H Caren Griffins, MD   5,000 Units at 11/06/16 [phone]  . insulin aspart (novoLOG) injection 0-9 Units  0-9 Units Subcutaneous TID WC Caren Griffins, MD   3 Units at 11/06/16 [phone]  . insulin glargine (LANTUS) injection 7 Units  7 Units Subcutaneous Daily Costin Karlyne Greenspan, MD   7 Units at 11/06/16 0945  . nystatin (MYCOSTATIN/NYSTOP) topical powder   Topical BID Carlyle Dolly, MD      . oxyCODONE-acetaminophen (PERCOCET/ROXICET) 5-325 MG per tablet 1 tablet  1 tablet Oral Q6H PRN Gardiner Barefoot, NP   1 tablet at 11/06/16 0202  . pantoprazole (PROTONIX) EC tablet 20 mg  20 mg Oral Daily Costin Karlyne Greenspan, MD   20 mg  at 11/06/16 0945  . polyethylene glycol (MIRALAX / GLYCOLAX) packet 17 g  17 g Oral Daily Caren Griffins, MD   17 g at 11/06/16 0945  . pravastatin (PRAVACHOL) tablet 40 mg  40 mg Oral q1800 Caren Griffins, MD   40 mg at 11/05/16 1750  . senna-docusate (Senokot-S) tablet 2 tablet  2 tablet Oral QHS PRN Rodney Elbe, DO   2 tablet at 11/06/16 0428  . sodium chloride flush (NS) 0.9 % injection 3 mL  3 mL Intravenous Q12H Caren Griffins, MD   3 mL at 11/06/16 0946  . spironolactone (ALDACTONE) tablet 25 mg  25 mg Oral Daily Caren Griffins, MD   25 mg at 11/06/16 0945  . tamsulosin (FLOMAX) capsule 0.4 mg  0.4 mg Oral QPC supper Caren Griffins, MD   0.4 mg at 11/05/16 1750     Discharge Medications: Please see discharge summary for a list of discharge medications.  Relevant Imaging Results:  Relevant Lab Results:   Additional Information    Rodney [name], LCSWA

## 2016-11-06 NOTE — Progress Notes (Signed)
PROGRESS NOTE    ESPER TRUELOCK  [id number] DOB: [birth year] DOA: 11/02/2016 PCP: Mitzie Na, MD   Brief Narrative:  [facility] [name] is a 82 y.o.malewith medical history significant of chronic systolic CHF, paroxysmal atrial fibrillation, diabetes mellitus, hypertension, hyperlipidemia, chronic kidney disease stage III, prior nephrolithiasis presents to the emergency room with chief complaint of left flank and back pain. He was admitted with concerns for UTI / Pyelopnephritis as well as Acute Decompensation of Chronic Systolic CHF. Patient is currently being diuresed and improving and states his back pain is also improving. Discussed with Social Worker yesterday and she is going to discuss placement options with the patient. Today patient's Na+ dropped even lower so I spoke with Dr. Hollie Salk in Nephrology and her partner Dr. Jonnie Finner will come see the patient.   Assessment & Plan:   Active Problems:   Pyelonephritis   Acute on chronic systolic CHF (congestive heart failure) (HCC)   DM (diabetes mellitus) ([place])   Atrial fibrillation (HCC)   HTN (hypertension)   HLD (hyperlipidemia)   ICD (implantable cardioverter-defibrillator) in place   CKD (chronic kidney disease) stage 3, GFR 30-59 ml/min   Hyponatremia   CAD (coronary artery disease)   Shortness of breath  Acute Decompensation of Chronic Systolic CHF with last EF of 30% - BNP on Admission was 1,885.7 - Most recent 2-D echo was done at [facility] in June 2017 and showed ejection fraction of 30%. - Will not repeat a 2-D echo this visit - C/w Carvedilol 3.125 mg po BID, C/w Spironolactone 25 mg po Daily - C/w IV Lasix 40 mg BID - Strict I's and O's, Daily Weights, Fluid Restriction, SLIV - Patient's Weight went from 177 >> 171 >> 174 >> 176 lbs - Total Output since admit is -6.2 Liters - CXR this AM showed stable cardiomegaly without overt edema.  Concern for Pyelonephritis UTI / Flank pain - Urine cultures and showed  Multiple Species Present, however he has been on ciprofloxacin and cultures may remain negative.  - C/w IV Ceftriaxone for now (day 5). Improving - C/w Fluconazole for Yeast in UTI and Nystatin Topical  - Patient has CVA tenderness on exam still but much improved. Pain may be superimposed  MSKpain - States back Pain is still there but not as bad.   Likely Hypervolemic Hyponatremia - Clinically appears fluid overloaded, increased IV Lasix and monitor sodium levels. - Urine Osmolality was 252, Urine Sodium was 59, Urine Urea Nitrogen pending - Sodium was 125 today and down from 128 yesterday  - Continue to Monitor BMP's. Repeat at 1500 BMP showed Na+ dropped to 122.  - Discussed with Dr. Madelon Lips in Nephrology today and her partner Dr. Jonnie Finner will come see the patient and evaluate patient.   Atrial Fibrillation - Patient's CHA2DS2-VASc Score for Stroke Risk is at least 5. - TSH was 8.173 - He has been on Coumadin at one point in the past, however had what seems like a GI bleed and he was taken off of anticoagulation. Continue aspirin. - Continue amiodarone 200 mg po Daily as well as Carvedilol 3.125 mg po BID  Diabetes Mellitus Type 2 - Place patient on Sensitive Novolog SSI; May increase to Moderate SSI - C/w Lantus 7 units sq daily - CBG's have been ranging from 244 - 362  Acute kidney injury on chronic kidney disease stage III, improving - Baseline creatinine around 1.3-1.4based on [facility] records, it hasincreased to 2.1 on admission likely multifactorial in the setting of  diabetes, hypertension,? Component of cardiorenal syndrome given long-standing CHF.  - BUN/Cr went from 40/1.77 -> 37/1.56 -> 27/1.29 -> 26/1.37 - Repeat BMP in AM - C/w IV Diuresis 40 mg Daily and likely switch to po in AM  Fall -PT Recommends SNF; Social Work [employer] for [employer].   Coronary Artery Disease -Patient had a recent evaluation in June 2017 at [facility] with a myocardial perfusion scan,  without any reversible perfusion defects. Has no chest pain.  - Continue Aspirin, Beta blocker and Statin - Slight troponin elevation likely due to Heart Failure Exacerbation, cardiac enzymes flat (0.14 -> 0.12 -> 0.10)  Hypertension -Decreased Coreg dose was receiving IV Lasix and had lower BP's - Continue to Monitor VS   Hyperlipidemia - Continue Pravastatin 40 mg po Daily  GERD - C/w Pantoprazole 40 mg po Daily  DVT prophylaxis: Heparin sq 5000 Units Code Status: FULL Family Communication: Discussed with Patient's Son at bedside  Disposition Plan: SNF; Social Work to visit with the patient.   Consultants:   None  Procedures:  None  Antimicrobials: IV Ceftriaxone 11/02/2016 -->  Subjective: Seen and examined at bedside and stated his chest wall pain was still bothering him on palpation. Wanted to know when he could go home. Discussed with Family later on the plan of care and they were receptive and all questions answered to their satisfaction. Patient states he still feels "crappy." No other concerns or complaints and states he is weak and gets SOB on exertion.   Objective: Vitals:   11/05/16 1955 11/06/16 0520 11/06/16 0842 11/06/16 1300  BP: 103/68 (!) 120/59 (!) 104/59 (!) 108/58  Pulse: 69 69 70 70  Resp: 18 18  20   Temp: 97.7 F (36.5 C) 97.5 F (36.4 C)  97.4 F (36.3 C)  TempSrc: Oral Oral  Oral  SpO2: 99% 91%  95%  Weight:  79.9 kg (176 lb 1.6 oz)    Height:        Intake/Output Summary (Last 24 hours) at 11/06/16 1447 Last data filed at 11/06/16 1241  Gross per 24 hour  Intake              243 ml  Output             1600 ml  Net            -1357 ml   Filed Weights   11/04/16 0500 11/05/16 0500 11/06/16 0520  Weight: 77.7 kg (171 lb 3.2 oz) 79 kg (174 lb 3.2 oz) 79.9 kg (176 lb 1.6 oz)    Examination: Physical Exam:  Constitutional: WN/WD, NAD and appears calm and comfortable Eyes: PERRL, Left Eye bruising noted, sclerae anicteric  ENMT:  External Ears, Nose appear normal. Grossly normal hearing.  Neck: Appears normal, supple, no cervical masses, normal ROM, no appreciable thyromegaly, Mild JVD.  Chest Wall: Patient had tenderness on Right side from fall.  Respiratory: Diminished to auscultation bilaterally, no wheezing, rales, rhonchi or crackles. Normal respiratory effort and patient is not tachypenic. No accessory muscle use.  Cardiovascular: RRR, no murmurs / rubs / gallops. S1 and S2 auscultated. 1+ extremity pitting edema.  Abdomen: Soft, non-tender, non-distended. No masses palpated. No appreciable hepatosplenomegaly. Bowel sounds positive.  GU: Deferred. Musculoskeletal: No clubbing / cyanosis of digits/nails. No joint deformity upper and lower extremities.  Skin: No rashes, lesions, ulcers. No induration; Warm and dry. Patient has bruising on Left eye, Right had has some abrasions.  Neurologic: CN 2-12 grossly intact with no  focal deficits. Sensation intact in all 4 Extremities. Romberg sign cerebellar reflexes not assessed.  Psychiatric: Normal judgment and insight. Alert and oriented x 3. Agitated mood and appropriate affect.   Data Reviewed: I have personally reviewed following labs and imaging studies  CBC:  Recent Labs Lab 11/02/16 1314 11/03/16 0458 11/04/16 1043 11/05/16 0252 11/06/16 0342  WBC 10.1 8.1 6.8 7.8 7.4  NEUTROABS  --   --  4.4 5.4 4.6  HGB 11.2* 10.2* 10.1* 10.1* 9.4*  HCT 35.3* 32.2* 31.6* 32.8* 30.0*  MCV 76.2* 76.3* 77.1* 77.4* 75.9*  PLT 235 219 214 229 [id number]   Basic Metabolic Panel:  Recent Labs Lab 11/03/16 0458 11/04/16 0204 11/05/16 0252 11/05/16 1415 11/06/16 0342  NA 130* 128* 126* 122* 125*  K 4.8 4.8 5.1 5.3* 4.4  CL 96* 94* 91* 88* 90*  CO2 27 26 25 23 25   GLUCOSE 245* 248* 234* 343* 181*  BUN 40* 36* 27* 28* 26*  CREATININE 1.77* 1.56* 1.29* 1.49* 1.37*  CALCIUM 8.8* 8.9 8.8* 8.8* 8.8*  MG  --   --  2.0  --  1.9  PHOS  --   --  3.0  --  3.2   GFR: Estimated  Creatinine Clearance: 35.6 mL/min (by C-G formula based on SCr of 1.37 mg/dL (H)). Liver Function Tests:  Recent Labs Lab 11/03/16 0458 11/05/16 0252 11/06/16 0342  AST 25 19 19   ALT 22 17 15*  ALKPHOS 122 119 118  BILITOT 1.3* 0.9 0.7  PROT 6.4* 6.6 6.7  ALBUMIN 3.4* 3.3* 3.1*   No results for input(s): LIPASE, AMYLASE in the last 168 hours. No results for input(s): AMMONIA in the last 168 hours. Coagulation Profile: No results for input(s): INR, PROTIME in the last 168 hours. Cardiac Enzymes:  Recent Labs Lab 11/02/16 1656 11/02/16 2234 11/03/16 0458  TROPONINI 0.14* 0.12* 0.10*   BNP (last 3 results) No results for input(s): PROBNP in the last 8760 hours. HbA1C: No results for input(s): HGBA1C in the last 72 hours. CBG:  Recent Labs Lab 11/05/16 0632 11/05/16 1129 11/05/16 1638 11/05/16 2101 11/06/16 0627  GLUCAP 207* 340* 300* 253* 209*   Lipid Profile: No results for input(s): CHOL, HDL, LDLCALC, TRIG, CHOLHDL, LDLDIRECT in the last 72 hours. Thyroid Function Tests: No results for input(s): TSH, T4TOTAL, FREET4, T3FREE, THYROIDAB in the last 72 hours. Anemia Panel: No results for input(s): VITAMINB12, FOLATE, FERRITIN, TIBC, IRON, RETICCTPCT in the last 72 hours. Sepsis Labs: No results for input(s): PROCALCITON, LATICACIDVEN in the last 168 hours.  Recent Results (from the past 240 hour(s))  Urine culture     Status: Abnormal   Collection Time: 11/02/16 10:27 AM  Result Value Ref Range Status   Specimen Description URINE, RANDOM  Final   Special Requests NONE  Final   Culture MULTIPLE SPECIES PRESENT, SUGGEST RECOLLECTION (A)  Final   Report Status 11/03/2016 FINAL  Final  Culture, Urine     Status: Abnormal   Collection Time: 11/02/16  7:08 PM  Result Value Ref Range Status   Specimen Description URINE, CLEAN CATCH  Final   Special Requests NONE  Final   Culture MULTIPLE SPECIES PRESENT, SUGGEST RECOLLECTION (A)  Final   Report Status  11/03/2016 FINAL  Final    Radiology Studies: Dg Chest Port 1 View  Result Date: 11/06/2016 CLINICAL DATA:  Shortness of breath. EXAM: PORTABLE CHEST 1 VIEW COMPARISON:  November 02, 2016 FINDINGS: Stable cardiomegaly and AICD device. The hila and mediastinum are  unchanged. No pulmonary nodules, masses, or infiltrates. No overt edema. IMPRESSION: Stable cardiomegaly without overt edema. Electronically Signed   By: Dorise Bullion III M.D   On: 11/06/2016 10:11     Scheduled Meds: . amiodarone  200 mg Oral Daily  . aspirin  81 mg Oral Daily  . carvedilol  3.125 mg Oral BID WC  . cefTRIAXone (ROCEPHIN)  IV  1 g Intravenous Q24H  . fluconazole  200 mg Oral Daily  . furosemide  40 mg Intravenous BID  . heparin  5,000 Units Subcutaneous Q8H  . insulin aspart  0-9 Units Subcutaneous TID WC  . insulin glargine  7 Units Subcutaneous Daily  . nystatin   Topical BID  . pantoprazole  20 mg Oral Daily  . polyethylene glycol  17 g Oral Daily  . pravastatin  40 mg Oral q1800  . sodium chloride flush  3 mL Intravenous Q12H  . spironolactone  25 mg Oral Daily  . tamsulosin  0.4 mg Oral QPC supper   Continuous Infusions:   LOS: 4 days   Kerney Elbe, DO [facility] Pager [phone]  If 7PM-7AM, please contact night-coverage [email or web address] Password [email or web address] 11/06/2016, 2:47 PM

## 2016-11-07 DIAGNOSIS — E119 Type 2 diabetes mellitus without complications: Secondary | ICD-10-CM

## 2016-11-07 LAB — CBC WITH DIFFERENTIAL/PLATELET
BASOS ABS: 0 10*3/uL (ref 0.0–0.1)
Basophils Relative: 0 %
EOS ABS: 0.2 10*3/uL (ref 0.0–0.7)
Eosinophils Relative: 4 %
HEMATOCRIT: 32.9 % — AB (ref 39.0–52.0)
Hemoglobin: 10.5 g/dL — ABNORMAL LOW (ref 13.0–17.0)
Lymphocytes Relative: 25 %
Lymphs Abs: 1.6 10*3/uL (ref 0.7–4.0)
MCH: 24.3 pg — AB (ref 26.0–34.0)
MCHC: 31.9 g/dL (ref 30.0–36.0)
MCV: 76.2 fL — AB (ref 78.0–100.0)
MONOS PCT: 14 %
Monocytes Absolute: 0.9 10*3/uL (ref 0.1–1.0)
NEUTROS ABS: 3.6 10*3/uL (ref 1.7–7.7)
Neutrophils Relative %: 57 %
PLATELETS: 255 10*3/uL (ref 150–400)
RBC: 4.32 MIL/uL (ref 4.22–5.81)
RDW: 18.5 % — ABNORMAL HIGH (ref 11.5–15.5)
WBC: 6.3 10*3/uL (ref 4.0–10.5)

## 2016-11-07 LAB — PHOSPHORUS: PHOSPHORUS: 2.8 mg/dL (ref 2.5–4.6)

## 2016-11-07 LAB — COMPREHENSIVE METABOLIC PANEL
ALK PHOS: 139 U/L — AB (ref 38–126)
ALT: 17 U/L (ref 17–63)
AST: 26 U/L (ref 15–41)
Albumin: 3.4 g/dL — ABNORMAL LOW (ref 3.5–5.0)
Anion gap: 11 (ref 5–15)
BILIRUBIN TOTAL: 0.5 mg/dL (ref 0.3–1.2)
BUN: 28 mg/dL — ABNORMAL HIGH (ref 6–20)
CALCIUM: 9.2 mg/dL (ref 8.9–10.3)
CHLORIDE: 86 mmol/L — AB (ref 101–111)
CO2: 26 mmol/L (ref 22–32)
CREATININE: 1.41 mg/dL — AB (ref 0.61–1.24)
GFR, EST AFRICAN AMERICAN: 49 mL/min — AB (ref >60)
GFR, EST NON AFRICAN AMERICAN: 42 mL/min — AB (ref >60)
Glucose, Bld: 203 mg/dL — ABNORMAL HIGH (ref 65–99)
Potassium: 4.3 mmol/L (ref 3.5–5.1)
Sodium: 123 mmol/L — ABNORMAL LOW (ref 135–145)
TOTAL PROTEIN: 6.9 g/dL (ref 6.5–8.1)

## 2016-11-07 LAB — GLUCOSE, CAPILLARY
GLUCOSE-CAPILLARY: 221 mg/dL — AB (ref 65–99)
Glucose-Capillary: 279 mg/dL — ABNORMAL HIGH (ref 65–99)
Glucose-Capillary: 346 mg/dL — ABNORMAL HIGH (ref 65–99)
Glucose-Capillary: 263 mg/dL — ABNORMAL HIGH (ref 65–99)

## 2016-11-07 LAB — MAGNESIUM: MAGNESIUM: 2 mg/dL (ref 1.7–2.4)

## 2016-11-07 MED ORDER — FUROSEMIDE 10 MG/ML IJ SOLN
80.0000 mg | Freq: Two times a day (BID) | INTRAMUSCULAR | Status: DC
  Administered 2016-11-07 – 2016-11-08 (×3): 80 mg via INTRAVENOUS
  Filled 2016-11-07 (×4): qty 8

## 2016-11-07 MED ORDER — INSULIN ASPART 100 UNIT/ML ~~LOC~~ SOLN
0.0000 [IU] | Freq: Three times a day (TID) | SUBCUTANEOUS | Status: DC
  Administered 2016-11-07: 11 [IU] via SUBCUTANEOUS
  Administered 2016-11-08: 4 [IU] via SUBCUTANEOUS
  Administered 2016-11-08: 15 [IU] via SUBCUTANEOUS
  Administered 2016-11-08 – 2016-11-09 (×2): 4 [IU] via SUBCUTANEOUS
  Administered 2016-11-09: 20 [IU] via SUBCUTANEOUS
  Administered 2016-11-09: 7 [IU] via SUBCUTANEOUS

## 2016-11-07 NOTE — Clinical Social Work Note (Signed)
Clinical Social Worker continuing to follow patient and family for support and discharge planning needs.  Patient has accepted bed offer at [facility] and is hopeful for discharge soon.  Patient has been seen by Nephrology and lasix dose has been increased.  CSW spoke with facility who states that patient bed will be available to patient once medically stable.  Patient son, Purcell Nails plans to provide transportation at discharge.  CSW remains available for support and to facilitate patient discharge needs once medically stable.  Barbette Or, [place]

## 2016-11-07 NOTE — Progress Notes (Signed)
PROGRESS NOTE    Rodney [name]  [id number] DOB: [birth year] DOA: 11/02/2016 PCP: Mitzie Na, MD   Brief Narrative:  Rodney [name] is a 82 y.o.malewith medical history significant of chronic systolic CHF, paroxysmal atrial fibrillation, diabetes mellitus, hypertension, hyperlipidemia, chronic kidney disease stage III, prior nephrolithiasis presents to [facility] emergency room with chief complaint of left flank and back pain. He was admitted with concerns for UTI / Pyelopnephritis as well as Acute Decompensation of Chronic Systolic CHF. [name] is currently being diuresed and improving and states his back pain is also improving. Discussed with Social Worker yesterday and she is going to discuss placement options with [facility] [name] for [place]. Today [name]'s Na+ dropped even lower so I spoke with Dr. Erling Cruz of Nephrology who will come evaluate [facility] [name] for his Hyponatremia.   Assessment & Plan:   Active Problems:   Pyelonephritis   Acute on chronic systolic CHF (congestive heart failure) (HCC)   DM (diabetes mellitus) ([place])   Atrial fibrillation (HCC)   HTN (hypertension)   HLD (hyperlipidemia)   ICD (implantable cardioverter-defibrillator) in place   CKD (chronic kidney disease) stage 3, GFR 30-59 ml/min   Hyponatremia   CAD (coronary artery disease)   Shortness of breath  Acute Decompensation of Chronic Systolic CHF with last EF of 30% - BNP on Admission was 1,885.7 - Most recent 2-D echo was done at [facility] in June 2017 and showed ejection fraction of 30%. - Will not repeat a 2-D echo this visit - C/w Carvedilol 3.125 mg po BID, C/w Spironolactone 25 mg po Daily - IV Lasix 40 mg BID increased to IV 80 mg BID per Nephrology.  - Strict I's and O's, Daily Weights, Fluid Restriction now to 800 mL/day, SLIV - [name]'s Weight went from 177 >> 171 >> 174 >> 176 >> 172lbs - Total Output since admit is -6.4 Liters - CXR yesterday showed stable cardiomegaly  without overt edema.  Concern for Pyelonephritis UTI / Flank pain - Urine cultures and showed Multiple Species Present, however he has been on ciprofloxacin and cultures may remain negative.  - C/w IV Ceftriaxone for now (Day 6). Improving - C/w Fluconazole for Yeast in UTI and Nystatin Topical  - [name] has CVA tenderness on exam still but much improved. Pain may be superimposed  MSKpain - States back Pain is still there but not as bad. Continue to Monitor  Likely Hypervolemic Hyponatremia - Clinically appears fluid overloaded. - Urine Osmolality was 252, Urine Sodium was 59, Urine Urea Nitrogen pending - Sodium was 123 today and down from 125 yesterday  - Continue to Monitor BMP's.  - Disucussed with Nephrology Dr. Erling Cruz for additional recommendations and evaluation - Nephrology increased Lasix to 80 mg IV BID and Fluid Restricted [name] to 800 mL/day  Atrial Fibrillation - [name]'s CHA2DS2-VASc Score for Stroke Risk is at least 5. - TSH was 8.173 - He has been on Coumadin at one point in [facility] past, however had what seems like a GI bleed and he was taken off of anticoagulation. Continue aspirin. - Continue Amiodarone 200 mg po Daily as well as Carvedilol 3.125 mg po BID  Diabetes Mellitus Type 2 - BS on BMP was 203 - Will increase Sensitive Novolog SSI to Resistant Novolog SSI - C/w Lantus 7 units sq daily - CBG's have been ranging from 279-346  Acute Kidney Injury on Chronic Kidney Disease stage III, improving - Baseline creatinine around 1.3-1.4based on [facility] records, it hasincreased to 2.1  on admission likely multifactorial in [facility] setting of diabetes, hypertension,? Component of cardiorenal syndrome given long-standing CHF.  - BUN/Cr went from 40/1.77 -> 37/1.56 -> 27/1.29 -> 26/1.37 -> 28/1.41 - Repeat BMP in AM - IV Lasix increased to 80 mg BID per Nephrology  Constipation, improved -Gave [name] Dulcolax Suppository -C/w Senna-Docusate 2 tablet po qHS prn  and Miralax 17 g Daily  Fall -PT Recommends SNF; Social Work [employer] for [employer].   Coronary Artery Disease -[name] had a recent evaluation in June 2017 at [facility] with a myocardial perfusion scan, without any reversible perfusion defects. Has no chest pain.  - Continue Aspirin, Beta blocker and Statin - Slight troponin elevation likely due to Heart Failure Exacerbation, cardiac enzymes flat (0.14 -> 0.12 -> 0.10)  Hypertension -Decreased Coreg dose was receiving IV Lasix and had lower BP's - Continue to Monitor VS   Hyperlipidemia - Continue Pravastatin 40 mg po Daily  GERD - C/w Pantoprazole 40 mg po Daily  DVT prophylaxis: Heparin sq 5000 Units Code Status: FULL Family Communication: No Family at Bedside Disposition Plan: SNF when Stable.    Consultants:   Nephrology  Procedures:  None  Antimicrobials: IV Ceftriaxone 11/02/2016 -->  Subjective: Seen and examined at bedside and he still feels SOB and states he "feels crappy." Still has swelling in his legs and wants to know when he can go to SNF. No Nausea or Vomiting. Back pain is stable. No other concerns or complaints except [name] states he is weak.  Objective: Vitals:   11/06/16 2100 11/07/16 0525 11/07/16 0805 11/07/16 1304  BP: (!) 121/59 110/64 (!) 100/59 (!) 99/52  Pulse: 69 71 70 70  Resp: 20 20  17   Temp: 97.5 F (36.4 C) 97.6 F (36.4 C)  97.7 F (36.5 C)  TempSrc: Oral Oral  Oral  SpO2: 100% 96%  98%  Weight:  78.4 kg (172 lb 14.4 oz)    Height:        Intake/Output Summary (Last 24 hours) at 11/07/16 1419 Last data filed at 11/07/16 1300  Gross per 24 hour  Intake              483 ml  Output              651 ml  Net             -168 ml   Filed Weights   11/05/16 0500 11/06/16 0520 11/07/16 0525  Weight: 79 kg (174 lb 3.2 oz) 79.9 kg (176 lb 1.6 oz) 78.4 kg (172 lb 14.4 oz)    Examination: Physical Exam:  Constitutional: WN/WD, NAD and appears calm and comfortable Eyes:  PERRL, Left Eye bruising noted, sclerae anicteric  ENMT: External Ears, Nose appear normal. Grossly normal hearing.  Neck: Appears normal, supple, no cervical masses, normal ROM, no appreciable thyromegaly. Chest Wall: [name] had tenderness on Right side from fall.  Respiratory: Diminished to auscultation bilaterally, no wheezing, rales, rhonchi or crackles. Normal respiratory effort and [name] is not tachypenic. No accessory muscle use.  Cardiovascular: RRR, no murmurs / rubs / gallops. S1 and S2 auscultated. 1+ extremity pitting edema.  Abdomen: Soft, non-tender, non-distended. No masses palpated. No appreciable hepatosplenomegaly. Bowel sounds positive.  GU: Deferred. Musculoskeletal: No clubbing / cyanosis of digits/nails. No joint deformity upper and lower extremities.  Skin: No rashes, lesions, ulcers. No induration; Warm and Dry. [name] has bruising on Left eye, Right had has some abrasions.  Neurologic: CN 2-12 grossly intact with no  focal deficits. Sensation intact in all 4 Extremities. Romberg sign cerebellar reflexes not assessed.  Psychiatric: Normal judgment and insight. Alert and oriented x 3. Normal mood and appropriate affect.   Data Reviewed: I have personally reviewed following labs and imaging studies  CBC:  Recent Labs Lab 11/03/16 0458 11/04/16 1043 11/05/16 0252 11/06/16 0342 11/07/16 0816  WBC 8.1 6.8 7.8 7.4 6.3  NEUTROABS  --  4.4 5.4 4.6 3.6  HGB 10.2* 10.1* 10.1* 9.4* 10.5*  HCT 32.2* 31.6* 32.8* 30.0* 32.9*  MCV 76.3* 77.1* 77.4* 75.9* 76.2*  PLT 219 214 229 226 [id number]   Basic Metabolic Panel:  Recent Labs Lab 11/04/16 0204 11/05/16 0252 11/05/16 1415 11/06/16 0342 11/07/16 0816  NA 128* 126* 122* 125* 123*  K 4.8 5.1 5.3* 4.4 4.3  CL 94* 91* 88* 90* 86*  CO2 26 25 23 25 26   GLUCOSE 248* 234* 343* 181* 203*  BUN 36* 27* 28* 26* 28*  CREATININE 1.56* 1.29* 1.49* 1.37* 1.41*  CALCIUM 8.9 8.8* 8.8* 8.8* 9.2  MG  --  2.0  --  1.9 2.0  PHOS  --   3.0  --  3.2 2.8   GFR: Estimated Creatinine Clearance: 31.9 mL/min (by C-G formula based on SCr of 1.41 mg/dL (H)). Liver Function Tests:  Recent Labs Lab 11/03/16 0458 11/05/16 0252 11/06/16 0342 11/07/16 0816  AST 25 19 19 26   ALT 22 17 15* 17  ALKPHOS 122 119 118 139*  BILITOT 1.3* 0.9 0.7 0.5  PROT 6.4* 6.6 6.7 6.9  ALBUMIN 3.4* 3.3* 3.1* 3.4*   No results for input(s): LIPASE, AMYLASE in [facility] last 168 hours. No results for input(s): AMMONIA in [facility] last 168 hours. Coagulation Profile: No results for input(s): INR, PROTIME in [facility] last 168 hours. Cardiac Enzymes:  Recent Labs Lab 11/02/16 1656 11/02/16 2234 11/03/16 0458  TROPONINI 0.14* 0.12* 0.10*   BNP (last 3 results) No results for input(s): PROBNP in [facility] last 8760 hours. HbA1C: No results for input(s): HGBA1C in [facility] last 72 hours. CBG:  Recent Labs Lab 11/06/16 0627 11/06/16 1704 11/06/16 2201 11/07/16 0603 11/07/16 1121  GLUCAP 209* 293* 341* 279* 346*   Lipid Profile: No results for input(s): CHOL, HDL, LDLCALC, TRIG, CHOLHDL, LDLDIRECT in [facility] last 72 hours. Thyroid Function Tests: No results for input(s): TSH, T4TOTAL, FREET4, T3FREE, THYROIDAB in [facility] last 72 hours. Anemia Panel: No results for input(s): VITAMINB12, FOLATE, FERRITIN, TIBC, IRON, RETICCTPCT in [facility] last 72 hours. Sepsis Labs: No results for input(s): PROCALCITON, LATICACIDVEN in [facility] last 168 hours.  Recent Results (from [facility] past 240 hour(s))  Urine culture     Status: Abnormal   Collection Time: 11/02/16 10:27 AM  Result Value Ref Range Status   Specimen Description URINE, RANDOM  Final   Special Requests NONE  Final   Culture MULTIPLE SPECIES PRESENT, SUGGEST RECOLLECTION (A)  Final   Report Status 11/03/2016 FINAL  Final  Culture, Urine     Status: Abnormal   Collection Time: 11/02/16  7:08 PM  Result Value Ref Range Status   Specimen Description URINE, CLEAN CATCH  Final   Special Requests NONE  Final   Culture MULTIPLE  SPECIES PRESENT, SUGGEST RECOLLECTION (A)  Final   Report Status 11/03/2016 FINAL  Final    Radiology Studies: Dg Chest Port 1 View  Result Date: 11/06/2016 CLINICAL DATA:  Shortness of breath. EXAM: PORTABLE CHEST 1 VIEW COMPARISON:  November 02, 2016 FINDINGS: Stable cardiomegaly and AICD device. [facility] hila and  mediastinum are unchanged. No pulmonary nodules, masses, or infiltrates. No overt edema. IMPRESSION: Stable cardiomegaly without overt edema. Electronically Signed   By: Dorise Bullion III M.D   On: 11/06/2016 10:11     Scheduled Meds: . amiodarone  200 mg Oral Daily  . aspirin  81 mg Oral Daily  . carvedilol  3.125 mg Oral BID WC  . cefTRIAXone (ROCEPHIN)  IV  1 g Intravenous Q24H  . fluconazole  200 mg Oral Daily  . furosemide  80 mg Intravenous BID  . heparin  5,000 Units Subcutaneous Q8H  . insulin aspart  0-9 Units Subcutaneous TID WC  . insulin glargine  7 Units Subcutaneous Daily  . nystatin   Topical BID  . pantoprazole  20 mg Oral Daily  . polyethylene glycol  17 g Oral Daily  . pravastatin  40 mg Oral q1800  . sodium chloride flush  3 mL Intravenous Q12H  . spironolactone  25 mg Oral Daily  . tamsulosin  0.4 mg Oral QPC supper   Continuous Infusions:   LOS: 4 days   Kerney Elbe, DO [facility] Pager [phone]  If 7PM-7AM, please contact night-coverage [email or web address] Password [email or web address] 11/07/2016, 2:19 PM

## 2016-11-07 NOTE — Progress Notes (Signed)
Physical Therapy Treatment Patient Details Name: Rodney [name] MRN: [id number] DOB: 11-20-1923 Today's Date: 11/07/2016    History of Present Illness pt is a 82 y/o male with PMH of CHF, AF, DM, HTN, CKD III, and multiple falls admitted with pylonephritis.     PT Comments    Pt admitted with above diagnosis. Pt currently with functional limitations due to balance and endurance deficits. Pt was able to ambulate a short distance but needed mod assist for stability and +1 assist for chair follow.  Continue to agree pt will benefit from skilled PT in SNF to gain strength as pt would not be safe at current level to return home alone.   Pt will benefit from skilled PT to increase their independence and safety with mobility to allow discharge to the venue listed below.    Follow Up Recommendations  SNF;Supervision/Assistance - 24 hour     Equipment Recommendations  None recommended by PT    Recommendations for Other Services OT consult     Precautions / Restrictions Precautions Precautions: Fall Restrictions Weight Bearing Restrictions: No    Mobility  Bed Mobility               General bed mobility comments: pt in chair on arrival  Transfers Overall transfer level: Needs assistance Equipment used: Rolling walker (2 wheeled) Transfers: Sit to/from [facility] Sit to Stand: Mod assist;+2 safety/equipment Stand pivot transfers: Mod assist;+2 safety/equipment       General transfer comment: pt requires cues for hand placement and forward weight shift as well as lifting assistance to stand..  Pt ambulated twice with need for same assist each time he stood.  Pt then needed to use 3N1 at end of treatment and pt needed mod assist to move LEs appropriately to chair.  Poor placement of LEs at times.  Needs wide BOS for safety and pt has difficulty widening BOS unless cued.    Ambulation/Gait Ambulation/Gait assistance: Mod assist;+2 safety/equipment Ambulation  Distance (Feet): 10 Feet (5 feet x 2.  ) Assistive device: Rolling walker (2 wheeled) Gait Pattern/deviations: Decreased stride length;Step-to pattern;Decreased step length - right;Decreased step length - left;Wide base of support;Trunk flexed;Staggering left;Staggering right;Leaning posteriorly;Antalgic   Gait velocity interpretation: Below normal speed for age/gender General Gait Details: Pt with severe varus deformity bilaterally.  Pt with short steps, unsteady overall with poor postural stability overall. Pt needed cues for safety and min to mod assist at times as he leans posteriorly and right and left at times.  Pt fatigues quickly and needed chair follow for this reason.  Pts Depends was wet on arrival therefore changed the Depends.   Stairs            Wheelchair Mobility    Modified Rankin (Stroke Patients Only)       Balance Overall balance assessment: Needs assistance;History of Falls       Postural control: Posterior lean Standing balance support: Bilateral upper extremity supported;During functional activity Standing balance-Leahy Scale: Poor Standing balance comment: Pt heavily reliant on UEs on RW for support.                      Cognition Arousal/Alertness: Awake/alert Behavior During Therapy: Restless Overall Cognitive Status: Within Functional Limits for tasks assessed                      Exercises General Exercises - Lower Extremity Ankle Circles/Pumps: AROM;Both;5 reps;Seated Long Arc Quad: AROM;Both;10 reps;Seated Hip  Flexion/Marching: AROM;Both;10 reps;Seated    General Comments        Pertinent Vitals/Pain Pain Assessment: Faces Faces Pain Scale: Hurts even more Pain Location: bil Knees Pain Descriptors / Indicators: Aching;Sore;Grimacing;Guarding Pain Intervention(s): Limited activity within patient's tolerance;Monitored during session;Repositioned  VSS with sats 93% with DOE 3/4.     Home Living                       Prior Function            PT Goals (current goals can now be found in the care plan section) Acute Rehab PT Goals Patient Stated Goal: Pt wants to be able to walk without pain Progress towards PT goals: Progressing toward goals    Frequency    Min 3X/week      PT Plan Current plan remains appropriate    Co-evaluation             End of Session Equipment Utilized During Treatment: Gait belt Activity Tolerance: Patient limited by fatigue;Patient limited by pain Patient left: on potty chair;with call bell/phone within reach and NT notified     Time: 0940-1003 PT Time Calculation (min) (ACUTE ONLY): 23 min  Charges:  $Gait Training: 8-22 mins $Self Care/Home Management: 8-22                    G Codes:      Godfrey Pick Remmie Bembenek 01-Dec-2016, 10:34 AM Amanda [facility] Acute Rehabilitation [phone] (pager)

## 2016-11-07 NOTE — Progress Notes (Signed)
OT Cancellation Note  Patient Details Name: Rodney [name] MRN: [id number] DOB: Oct 01, 1923   Cancelled Treatment:    Reason Eval/Treat Not Completed: Other (comment) Pt is Medicare and current D/C plan is SNF. No apparent immediate acute care OT needs, therefore will defer OT to SNF. If OT eval is needed please call Acute Rehab Dept. at [phone] or text page OT at [phone].    Almon Register [id number] 11/07/2016, 7:11 AM

## 2016-11-07 NOTE — Progress Notes (Signed)
Inpatient Diabetes Program Recommendations  AACE/ADA: New Consensus Statement on Inpatient Glycemic Control (2015)  Target Ranges:  Prepandial:   less than 140 mg/dL      Peak postprandial:   less than 180 mg/dL (1-2 hours)      Critically ill patients:  140 - 180 mg/dL   Lab Results  Component Value Date   GLUCAP 346 (H) 11/07/2016    Review of Glycemic Control Results for Rodney [name], Rodney [name] (MRN [id number]) as of 11/07/2016 12:34  Ref. Range 11/06/2016 06:27 11/06/2016 17:04 11/06/2016 22:01 11/07/2016 06:03 11/07/2016 11:21  Glucose-Capillary Latest Ref Range: 65 - 99 mg/dL 209 (H) 293 (H) 341 (H) 279 (H) 346 (H)   Diabetes history: DM2 Outpatient Diabetes medications: 70/30 insulin 30 units am +6 units pm Current orders for Inpatient glycemic control: Novolog correction 0-9 units tid  Inpatient Diabetes Program Recommendations:  Noted last elevated CBGs. Please consider: -Increase Lantus to 10 units -Novolog 2-3 units meal coverage if eats 50% meals. Or Transition back to 70/30 if eating well.  Thank you, Nani Gasser. Rhaelyn Giron, RN, MSN, CDE Inpatient Glycemic Control Team Team Pager [phone] (8am-5pm) 11/07/2016 12:36 PM

## 2016-11-07 NOTE — Consult Note (Signed)
Rodney [name] is a 82 y.o. male with medical history significant of chronic systolic CHF, paroxysmal atrial fibrillation, diabetes mellitus, hypertension, hyperlipidemia, chronic kidney disease stage III, prior nephrolithiasis presented to the emergency room with chief complaint of left flank and back pain.He was seen about 3 days ago in urgent care and was diagnosed with a urinary tract infection and concern for pyelonephritis, he was given ciprofloxacin. He did not feel like his back pain got any better, and presented to the [facility] 2 days PTA, and he underwent a CT scan of the abdomen and pelvis without contrast to evaluate for kidney stones, and this was negative. He also had a fall PTA and denied any loss of consciousness, denied any chest pain. Patient has been noticing more lower extremity swelling and some SOB PTA. Admission CXR did not show CHF. Cr was 1.7, BUN 40 and sodium 130. Depite a neg fluid balance of 6.4 liters pt has worsening hyponatremia to 158mq/l.  He state he drinks a lot of water.  Of note on 08/11/16 sodium was 128( BS 412) and creat 1.3, on 08/13/16 133(BS 173) and creat 1.4 and on 10/30/16 creat was 2.1 and sodium 137.   Past Medical History:  Diagnosis Date  . Arthritis    "all over" (11/02/2016)  . CHF (congestive heart failure) ([place]   . Chronic kidney disease (CKD), stage III (moderate)    /[name]11/15/2017  . COPD (chronic obstructive pulmonary disease) ([place]   . Hematuria    intermittent/notes 11/02/2016  . High cholesterol   . History of kidney stones 1959  . Hypertension   . Myocardial infarction    "I had 4 at one time; ~ 2015" (11/02/2016)  . Presence of permanent cardiac pacemaker   . Prostate cancer ([facility]   . Pyelonephritis 11/02/2016  . Ruptured lumbar disc    "4 of them"  . Type II diabetes mellitus ([place]    Past Surgical History:  Procedure Laterality Date  . APPENDECTOMY    . CATARACT EXTRACTION W/ INTRAOCULAR LENS  IMPLANT, BILATERAL  Bilateral   . CORONARY ANGIOPLASTY WITH STENT PLACEMENT    . CORONARY ARTERY BYPASS GRAFT    . [place] . INGUINAL HERNIA REPAIR Left   . INSERT / REPLACE / REMOVE PACEMAKER  2010  . INSERTION PROSTATE RADIATION SEED  ~ 1990  . JOINT REPLACEMENT    . TOTAL SHOULDER ARTHROPLASTY Right 2005   Social History:  reports that he quit smoking about 35 years ago. His smoking use included Cigarettes. He has a 2.40 pack-year smoking history. He quit smokeless tobacco use about 35 years ago. His smokeless tobacco use included Chew. He reports that he drinks alcohol. He reports that he does not use drugs. Allergies:  Allergies  Allergen Reactions  . Tape Other (See Comments)    SKIN IS VERY THIN AND WILL TEAR & BRUISE EASILY!!  . Sulfa Antibiotics Rash   History reviewed. No pertinent family history.  Medications:  Scheduled: . amiodarone  200 mg Oral Daily  . aspirin  81 mg Oral Daily  . carvedilol  3.125 mg Oral BID WC  . cefTRIAXone (ROCEPHIN)  IV  1 g Intravenous Q24H  . fluconazole  200 mg Oral Daily  . furosemide  80 mg Intravenous BID  . heparin  5,000 Units Subcutaneous Q8H  . insulin aspart  0-9 Units Subcutaneous TID WC  . insulin glargine  7 Units Subcutaneous Daily  . nystatin  Topical BID  . pantoprazole  20 mg Oral Daily  . polyethylene glycol  17 g Oral Daily  . pravastatin  40 mg Oral q1800  . sodium chloride flush  3 mL Intravenous Q12H  . spironolactone  25 mg Oral Daily  . tamsulosin  0.4 mg Oral QPC supper   ROS: as per HPI Blood pressure (!) 99/52, pulse 70, temperature 97.7 F (36.5 C), temperature source Oral, resp. rate 17, height _0  (1.702 m), weight 78.4 kg (172 lb 14.4 oz), SpO2 98 %.  General appearance: alert and cooperative Head: Normocephalic, without obvious abnormality, ecchymosis left cheek Eyes: conjunctivae/corneas clear. PERRL, EOM's intact. Fundi benign. Resp: clear to auscultation bilaterally Chest wall: no  tenderness Cardio: regular rate and rhythm, S1, S2 normal, no murmur, click, rub or gallop GI: soft, non-tender; bowel sounds normal; no masses,  no organomegaly Extremities: edema 1-2+ pitting Skin: Skin color, texture, turgor normal. No rashes or lesions Neurologic: Grossly normal Results for orders placed or performed during the hospital encounter of 11/02/16 (from the past 48 hour(s))  Glucose, capillary     Status: Abnormal   Collection Time: 11/05/16  4:38 PM  Result Value Ref Range   Glucose-Capillary 300 (H) 65 - 99 mg/dL  Sodium, urine, random     Status: None   Collection Time: 11/05/16  6:46 PM  Result Value Ref Range   Sodium, Ur 59 mmol/L  Osmolality, urine     Status: Abnormal   Collection Time: 11/05/16  6:47 PM  Result Value Ref Range   Osmolality, Ur 252 (L) 300 - 900 mOsm/kg  Urea nitrogen, urine     Status: None   Collection Time: 11/05/16  6:47 PM  Result Value Ref Range   Urea Nitrogen, Ur 211 Not Estab. mg/dL    Comment: (NOTE) Performed At: [facility] [place], [place] [id number] Lindon Romp MD [id number]   Glucose, capillary     Status: Abnormal   Collection Time: 11/05/16  9:01 PM  Result Value Ref Range   Glucose-Capillary 253 (H) 65 - 99 mg/dL   Comment 1 Notify RN   CBC with Differential/Platelet     Status: Abnormal   Collection Time: 11/06/16  3:42 AM  Result Value Ref Range   WBC 7.4 4.0 - 10.5 K/uL   RBC 3.95 (L) 4.22 - 5.81 MIL/uL   Hemoglobin 9.4 (L) 13.0 - 17.0 g/dL   HCT 30.0 (L) 39.0 - 52.0 %   MCV 75.9 (L) 78.0 - 100.0 fL   MCH 23.8 (L) 26.0 - 34.0 pg   MCHC 31.3 30.0 - 36.0 g/dL   RDW 18.3 (H) 11.5 - 15.5 %   Platelets 226 150 - 400 K/uL   Neutrophils Relative % 62 %   Neutro Abs 4.6 1.7 - 7.7 K/uL   Lymphocytes Relative 24 %   Lymphs Abs 1.8 0.7 - 4.0 K/uL   Monocytes Relative 11 %   Monocytes Absolute 0.8 0.1 - 1.0 K/uL   Eosinophils Relative 3 %   Eosinophils Absolute 0.3 0.0 - 0.7 K/uL    Basophils Relative 0 %   Basophils Absolute 0.0 0.0 - 0.1 K/uL  Comprehensive metabolic panel     Status: Abnormal   Collection Time: 11/06/16  3:42 AM  Result Value Ref Range   Sodium 125 (L) 135 - 145 mmol/L   Potassium 4.4 3.5 - 5.1 mmol/L    Comment: DELTA CHECK NOTED   Chloride 90 (L) 101 - 111  mmol/L   CO2 25 22 - 32 mmol/L   Glucose, Bld 181 (H) 65 - 99 mg/dL   BUN 26 (H) 6 - 20 mg/dL   Creatinine, Ser 1.37 (H) 0.61 - 1.24 mg/dL   Calcium 8.8 (L) 8.9 - 10.3 mg/dL   Total Protein 6.7 6.5 - 8.1 g/dL   Albumin 3.1 (L) 3.5 - 5.0 g/dL   AST 19 15 - 41 U/L   ALT 15 (L) 17 - 63 U/L   Alkaline Phosphatase 118 38 - 126 U/L   Total Bilirubin 0.7 0.3 - 1.2 mg/dL   GFR calc non Af Amer 43 (L) >60 mL/min   GFR calc Af Amer 50 (L) >60 mL/min    Comment: (NOTE) The eGFR has been calculated using the CKD EPI equation. This calculation has not been validated in all clinical situations. eGFR's persistently <60 mL/min signify possible Chronic Kidney Disease.    Anion gap 10 5 - 15  Magnesium     Status: None   Collection Time: 11/06/16  3:42 AM  Result Value Ref Range   Magnesium 1.9 1.7 - 2.4 mg/dL  Phosphorus     Status: None   Collection Time: 11/06/16  3:42 AM  Result Value Ref Range   Phosphorus 3.2 2.5 - 4.6 mg/dL  Glucose, capillary     Status: Abnormal   Collection Time: 11/06/16  6:27 AM  Result Value Ref Range   Glucose-Capillary 209 (H) 65 - 99 mg/dL  Glucose, capillary     Status: Abnormal   Collection Time: 11/06/16  5:04 PM  Result Value Ref Range   Glucose-Capillary 293 (H) 65 - 99 mg/dL  Glucose, capillary     Status: Abnormal   Collection Time: 11/06/16 10:01 PM  Result Value Ref Range   Glucose-Capillary 341 (H) 65 - 99 mg/dL   Comment 1 Notify RN   Glucose, capillary     Status: Abnormal   Collection Time: 11/07/16  6:03 AM  Result Value Ref Range   Glucose-Capillary 279 (H) 65 - 99 mg/dL  CBC with Differential/Platelet     Status: Abnormal   Collection  Time: 11/07/16  8:16 AM  Result Value Ref Range   WBC 6.3 4.0 - 10.5 K/uL    Comment: WHITE COUNT CONFIRMED ON SMEAR   RBC 4.32 4.22 - 5.81 MIL/uL   Hemoglobin 10.5 (L) 13.0 - 17.0 g/dL   HCT 32.9 (L) 39.0 - 52.0 %   MCV 76.2 (L) 78.0 - 100.0 fL   MCH 24.3 (L) 26.0 - 34.0 pg   MCHC 31.9 30.0 - 36.0 g/dL   RDW 18.5 (H) 11.5 - 15.5 %   Platelets 255 150 - 400 K/uL   Neutro Abs 3.6 1.7 - 7.7 K/uL   Lymphs Abs 1.6 0.7 - 4.0 K/uL   Monocytes Absolute 0.9 0.1 - 1.0 K/uL   Eosinophils Absolute 0.2 0.0 - 0.7 K/uL   Basophils Absolute 0.0 0.0 - 0.1 K/uL   Neutrophils Relative % 57 %   Lymphocytes Relative 25 %   Monocytes Relative 14 %   Eosinophils Relative 4 %   Basophils Relative 0 %   RBC Morphology ELLIPTOCYTES     Comment: POLYCHROMASIA PRESENT  Comprehensive metabolic panel     Status: Abnormal   Collection Time: 11/07/16  8:16 AM  Result Value Ref Range   Sodium 123 (L) 135 - 145 mmol/L   Potassium 4.3 3.5 - 5.1 mmol/L   Chloride 86 (L) 101 - 111 mmol/L  CO2 26 22 - 32 mmol/L   Glucose, Bld 203 (H) 65 - 99 mg/dL   BUN 28 (H) 6 - 20 mg/dL   Creatinine, Ser 1.41 (H) 0.61 - 1.24 mg/dL   Calcium 9.2 8.9 - 10.3 mg/dL   Total Protein 6.9 6.5 - 8.1 g/dL   Albumin 3.4 (L) 3.5 - 5.0 g/dL   AST 26 15 - 41 U/L   ALT 17 17 - 63 U/L   Alkaline Phosphatase 139 (H) 38 - 126 U/L   Total Bilirubin 0.5 0.3 - 1.2 mg/dL   GFR calc non Af Amer 42 (L) >60 mL/min   GFR calc Af Amer 49 (L) >60 mL/min    Comment: (NOTE) The eGFR has been calculated using the CKD EPI equation. This calculation has not been validated in all clinical situations. eGFR's persistently <60 mL/min signify possible Chronic Kidney Disease.    Anion gap 11 5 - 15  Magnesium     Status: None   Collection Time: 11/07/16  8:16 AM  Result Value Ref Range   Magnesium 2.0 1.7 - 2.4 mg/dL  Phosphorus     Status: None   Collection Time: 11/07/16  8:16 AM  Result Value Ref Range   Phosphorus 2.8 2.5 - 4.6 mg/dL   Glucose, capillary     Status: Abnormal   Collection Time: 11/07/16 11:21 AM  Result Value Ref Range   Glucose-Capillary 346 (H) 65 - 99 mg/dL   Comment 1 Notify RN    Dg Chest Port 1 View  Result Date: 11/06/2016 CLINICAL DATA:  Shortness of breath. EXAM: PORTABLE CHEST 1 VIEW COMPARISON:  November 02, 2016 FINDINGS: Stable cardiomegaly and AICD device. The hila and mediastinum are unchanged. No pulmonary nodules, masses, or infiltrates. No overt edema. IMPRESSION: Stable cardiomegaly without overt edema. Electronically Signed   By: Dorise Bullion III M.D   On: 11/06/2016 10:11    Assessment:  1 Hypervolemic hyponatremia 2 CKD 3 DM OOC  Plan: 1 Cont to diurese 2 Fluid restrict to 800cc/day  Najeeb Uptain C 11/07/2016, 2:29 PM

## 2016-11-07 NOTE — Clinical Social Work Note (Signed)
Clinical Social Work Assessment  Patient Details  Name: Rodney [name] MRN: [id number] Date of Birth: 1922-12-21  Date of referral:  11/07/16               Reason for consult:  Discharge Planning                Permission sought to share information with:  Family Supports Permission granted to share information::  Yes, Release of Information Signed  Name::     Rodney [name]  Agency::     Relationship::  Son  Contact Information:  [phone]  Housing/Transportation Living arrangements for the past 2 months:  Single Family Home Source of Information:  Patient Patient Interpreter Needed:  None Criminal Activity/Legal Involvement Pertinent to Current Situation/Hospitalization:  No - Comment as needed Significant Relationships:  Adult Children Lives with:  Self Do you feel safe going back to the place where you live?  Yes Need for family participation in patient care:  Yes (Comment)  Care giving concerns:  No family/friends at bedside. Patient stated that he has two adult children that are involved in his care but pt stated he does live alone   Facilities manager / plan:  Holiday representative met with patient(pt) at bedside to offer support and discuss pt needs at discharge. Pt stated he has been living at home and wishes to return after his stay at a [facility]. Pt is agreeable to SNF placement and would prefer placement in [place]. CSW spoke to pt's oldest son [name] and both pt and son prefer placement at [facility] and [place]. CSW to complete necessary paperwork and initiate SNF referral. CSW to follow up with pt' once bed offers are available. CSW remains available for support and to facilitate patient discharge needs once medically stable.   Employment status:  Retired Forensic scientist:  Medicare PT Recommendations:  [place] / Referral to community resources:  [place]  Patient/Family's Response to care:  Pt verbalized appreciation and understanding for CSW role and involvement in care. Pt is agreeable with current discharge plan to SNF following discharge  Patient/Family's Understanding of and Emotional Response to Diagnosis, Current Treatment, and Prognosis:  Pt has a good understanding of current medical state and limitations around most recent hospitalization. Pt is agreeable with SNF placement in hopes of transitioning back to independent living.  Emotional Assessment Appearance:  Appears stated age Attitude/Demeanor/Rapport:  Other (attitude/demeanor was appropriate ) Affect (typically observed):  Accepting, Calm, Pleasant Orientation:  Oriented to Self, Oriented to Place, Oriented to  Time, Oriented to Situation Alcohol / Substance use:  Not Applicable Psych involvement (Current and /or in the community):  No (Comment)  Discharge Needs  Concerns to be addressed:  Discharge Planning Concerns Readmission within the last 30 days:  No Current discharge risk:  Lives alone Barriers to Discharge:  Other   Rhea Pink, MSW,  [place]

## 2016-11-07 NOTE — Clinical Social Work Placement (Signed)
   CLINICAL SOCIAL WORK PLACEMENT  NOTE  Date:  11/07/2016  Patient Details  Name: Rodney [name] MRN: [id number] Date of Birth: Oct 07, 1923  Clinical Social Work is seeking post-discharge placement for this patient at the [place] level of care (*CSW will initial, date and re-position this form in  chart as items are completed):  Yes   Patient/family provided with [place] Work Department's list of facilities offering this level of care within the geographic area requested by the patient (or if unable, by the patient's family).  Yes   Patient/family informed of their freedom to choose among providers that offer the needed level of care, that participate in Medicare, Medicaid or managed care program needed by the patient, have an available bed and are willing to accept the patient.  No   Patient/family informed of [place]'s ownership interest in [facility] and [facility], as well as of the fact that they are under no obligation to receive care at these facilities.  PASRR submitted to EDS on 11/06/16     PASRR number received on       Existing PASRR number confirmed on 11/06/16     FL2 transmitted to all facilities in geographic area requested by pt/family on       FL2 transmitted to all facilities within larger geographic area on 11/06/16     Patient informed that his/her managed care company has contracts with or will negotiate with certain facilities, including the following:        Yes   Patient/family informed of bed offers received.  Patient [facility] bed at [facility] and [facility]     Physician recommends and patient chooses bed at [facility] and [place]    Patient to be transferred to [facility] on  .  Patient to be transferred to facility by PTAR      Patient family notified on 11/07/16 of transfer.  Name of family member notified:  Harlo Hammers      PHYSICIAN Please sign FL2     Additional Comment:    _______________________________________________ Wende Neighbors, LCSW 11/07/2016, 1:59 PM

## 2016-11-07 NOTE — Clinical Social Work Placement (Signed)
   CLINICAL SOCIAL WORK PLACEMENT  NOTE  Date:  11/07/2016  Patient Details  Name: Rodney [name] MRN: [id number] Date of Birth: 1923/09/27  Clinical Social Work is seeking post-discharge placement for this patient at the [place] level of care (*CSW will initial, date and re-position this form in  chart as items are completed):  Yes   Patient/family provided with [place] Work Department's list of facilities offering this level of care within the geographic area requested by the patient (or if unable, by the patient's family).  Yes   Patient/family informed of their freedom to choose among providers that offer the needed level of care, that participate in Medicare, Medicaid or managed care program needed by the patient, have an available bed and are willing to accept the patient.  No   Patient/family informed of [place]'s ownership interest in [facility] and [facility], as well as of the fact that they are under no obligation to receive care at these facilities.  PASRR submitted to EDS on 11/06/16     PASRR number received on       Existing PASRR number confirmed on 11/06/16     FL2 transmitted to all facilities in geographic area requested by pt/family on       FL2 transmitted to all facilities within larger geographic area on 11/06/16     Patient informed that his/her managed care company has contracts with or will negotiate with certain facilities, including the following:        Yes   Patient/family informed of bed offers received.  Patient chooses bed at [facility] and [facility]     Physician recommends and patient chooses bed at [facility] and [place]    Patient to be transferred to [place] [facility] on  .  Patient to be transferred to facility by PTAR      Patient family notified on 11/07/16 of transfer.  Name of family member notified:  Sidi Cottrill      PHYSICIAN Please sign FL2     Additional Comment:    _______________________________________________ Rhea Pink, MSW,  LCSWA [phone]

## 2016-11-08 ENCOUNTER — Inpatient Hospital Stay (HOSPITAL_COMMUNITY): Payer: Medicare Other

## 2016-11-08 LAB — GLUCOSE, CAPILLARY
GLUCOSE-CAPILLARY: 308 mg/dL — AB (ref 65–99)
GLUCOSE-CAPILLARY: 169 mg/dL — AB (ref 65–99)
Glucose-Capillary: 284 mg/dL — ABNORMAL HIGH (ref 65–99)
Glucose-Capillary: 189 mg/dL — ABNORMAL HIGH (ref 65–99)
Glucose-Capillary: 291 mg/dL — ABNORMAL HIGH (ref 65–99)

## 2016-11-08 LAB — CBC WITH DIFFERENTIAL/PLATELET
Basophils Absolute: 0 10*3/uL (ref 0.0–0.1)
Basophils Relative: 0 %
EOS ABS: 0.3 10*3/uL (ref 0.0–0.7)
EOS PCT: 4 %
HCT: 31.8 % — ABNORMAL LOW (ref 39.0–52.0)
Hemoglobin: 10.2 g/dL — ABNORMAL LOW (ref 13.0–17.0)
LYMPHS ABS: 1.6 10*3/uL (ref 0.7–4.0)
Lymphocytes Relative: 24 %
MCH: 24.5 pg — AB (ref 26.0–34.0)
MCHC: 32.1 g/dL (ref 30.0–36.0)
MCV: 76.3 fL — AB (ref 78.0–100.0)
MONO ABS: 0.7 10*3/uL (ref 0.1–1.0)
Monocytes Relative: 11 %
Neutro Abs: 4.1 10*3/uL (ref 1.7–7.7)
Neutrophils Relative %: 61 %
PLATELETS: 256 10*3/uL (ref 150–400)
RBC: 4.17 MIL/uL — AB (ref 4.22–5.81)
RDW: 18.8 % — AB (ref 11.5–15.5)
WBC: 6.7 10*3/uL (ref 4.0–10.5)

## 2016-11-08 LAB — COMPREHENSIVE METABOLIC PANEL
ALT: 16 U/L — ABNORMAL LOW (ref 17–63)
ANION GAP: 12 (ref 5–15)
AST: 25 U/L (ref 15–41)
Albumin: 3.4 g/dL — ABNORMAL LOW (ref 3.5–5.0)
Alkaline Phosphatase: 148 U/L — ABNORMAL HIGH (ref 38–126)
BUN: 34 mg/dL — ABNORMAL HIGH (ref 6–20)
CHLORIDE: 88 mmol/L — AB (ref 101–111)
CO2: 25 mmol/L (ref 22–32)
Calcium: 8.9 mg/dL (ref 8.9–10.3)
Creatinine, Ser: 1.47 mg/dL — ABNORMAL HIGH (ref 0.61–1.24)
GFR, EST AFRICAN AMERICAN: 46 mL/min — AB (ref >60)
GFR, EST NON AFRICAN AMERICAN: 40 mL/min — AB (ref >60)
Glucose, Bld: 328 mg/dL — ABNORMAL HIGH (ref 65–99)
POTASSIUM: 4.6 mmol/L (ref 3.5–5.1)
SODIUM: 125 mmol/L — AB (ref 135–145)
Total Bilirubin: 0.4 mg/dL (ref 0.3–1.2)
Total Protein: 7.1 g/dL (ref 6.5–8.1)

## 2016-11-08 LAB — PHOSPHORUS: Phosphorus: 3.5 mg/dL (ref 2.5–4.6)

## 2016-11-08 LAB — MAGNESIUM: Magnesium: 1.9 mg/dL (ref 1.7–2.4)

## 2016-11-08 MED ORDER — INSULIN ASPART 100 UNIT/ML ~~LOC~~ SOLN
4.0000 [IU] | Freq: Once | SUBCUTANEOUS | Status: AC
  Administered 2016-11-08: 4 [IU] via SUBCUTANEOUS

## 2016-11-08 MED ORDER — INSULIN GLARGINE 100 UNIT/ML ~~LOC~~ SOLN
10.0000 [IU] | Freq: Every day | SUBCUTANEOUS | Status: DC
  Administered 2016-11-09: 10 [IU] via SUBCUTANEOUS
  Filled 2016-11-08: qty 0.1

## 2016-11-08 NOTE — Progress Notes (Signed)
PROGRESS NOTE    Rodney [name]  [id number] DOB: 10-Dec-1923 DOA: 11/02/2016 PCP: Mitzie Na, MD   Brief Narrative:  Rodney [name] is a 82 y.o.malewith medical history significant of chronic systolic CHF, paroxysmal atrial fibrillation, diabetes mellitus, hypertension, hyperlipidemia, chronic kidney disease stage III, prior nephrolithiasis presents to the emergency room with chief complaint of left flank and back pain. He was admitted with concerns for UTI / Pyelopnephritis as well as Acute Decompensation of Chronic Systolic CHF. Patient is currently being diuresed and improving and states his back pain is also improving. Discussed with Social Worker yesterday and she is going to discuss placement options with the patient for [place]. Today patient's Na+ dropped even lower so I spoke with Dr. Erling Cruz of Nephrology who will come evaluate the patient for his Hyponatremia. Patient is slowly improving with diuresis but however still complains of significant Back Pain and stated it "felt like a kidney stone."  Assessment & Plan:   Active Problems:   Pyelonephritis   Acute on chronic systolic CHF (congestive heart failure) (HCC)   DM (diabetes mellitus) ([place])   Atrial fibrillation (HCC)   HTN (hypertension)   HLD (hyperlipidemia)   ICD (implantable cardioverter-defibrillator) in place   CKD (chronic kidney disease) stage 3, GFR 30-59 ml/min   Hyponatremia   CAD (coronary artery disease)   Shortness of breath  Acute Decompensation of Chronic Systolic CHF with last EF of 30% - BNP on Admission was 1,885.7 - Most recent 2-D echo was done at [facility] in June 2017 and showed ejection fraction of 30%. - Will not repeat a 2-D echo this visit - C/w Carvedilol 3.125 mg po BID, C/w Spironolactone 25 mg po Daily - C/w IV 80 mg BID per Nephrology.  - Strict I's and O's, Daily Weights, Fluid Restriction now to 800 mL/day, SLIV - Patient's Weight went from 177 >> 171 >>  174 >> 176 >> 172 >> 172  lbs - Total Output since admit is -8.2 Liters - Findings on CXR today showed Findings indicative of a degree of pulmonary vascular congestion. No frank edema or consolidation.  Concern for Pyelonephritis UTI / Flank pain - Urine cultures and showed Multiple Species Present, however he has been on ciprofloxacin and cultures may remain negative.  - C/w IV Ceftriaxone for now (Day 7). - C/w Fluconazole for Yeast in UTI and Nystatin Topical  - Patient has CVA tenderness on exam still. Pain may be superimposed  MSKpain - May consider obtaining a CT Scan of Abd/Pelvis if worse  Likely Hypervolemic Hyponatremia - Clinically appears fluid overloaded. - Urine Osmolality was 252, Urine Sodium was 59, Urine Urea Nitrogen pending - Sodium was 125 today - Continue to Monitor BMP's.  - Disucussed with Nephrology Dr. Erling Cruz for additional recommendations and evaluation - Nephrology increased Lasix to 80 mg IV BID and Fluid Restricted patient to 800 mL/day yesterday and will continue  Atrial Fibrillation - Patient's CHA2DS2-VASc Score for Stroke Risk is at least 5. - TSH was 8.173 - He has been on Coumadin at one point in the past, however had what seems like a GI bleed and he was taken off of anticoagulation. Continue aspirin. - Continue Amiodarone 200 mg po Daily as well as Carvedilol 3.125 mg po BID  Diabetes Mellitus Type 2 - BS on BMP was 203 - C/w Resistant Novolog SSI - Increase Lantus 7 units sq daily to 10 units daily per Diabetic Coordinator - Will likely require Pre-Meal Insulin -  CBG's have been ranging from 169-308 Today  Acute Kidney Injury on Chronic Kidney Disease stage III, improving - Baseline creatinine around 1.3-1.4based on [facility] records, it hasincreased to 2.1 on admission likely multifactorial in the setting of diabetes, hypertension,? Component of cardiorenal syndrome given long-standing CHF.  - BUN/Cr went from 40/1.77 -> 37/1.56 ->  27/1.29 -> 26/1.37 -> 28/1.41 -> 34/1.47 - Repeat BMP in AM - IV Lasix increased to 80 mg BID per Nephrology  Constipation, improved -Gave patient Dulcolax Suppository -C/w Senna-Docusate 2 tablet po qHS prn and Miralax 17 g Daily  Fall -PT Recommends SNF; Social Work [employer] for [employer].   Coronary Artery Disease -Patient had a recent evaluation in June 2017 at [facility] with a myocardial perfusion scan, without any reversible perfusion defects. Has no chest pain.  - Continue Aspirin, Beta blocker and Statin - Slight troponin elevation likely due to Heart Failure Exacerbation, cardiac enzymes flat (0.14 -> 0.12 -> 0.10)  Hypertension -Decreased Coreg dose to 3.125 mg po BID  was receiving IV Lasix and had lower BP's - Continue to Monitor VS   Hyperlipidemia - Continue Pravastatin 40 mg po Daily  GERD - C/w Pantoprazole 40 mg po Daily  DVT prophylaxis: Heparin sq 5000 Units Code Status: FULL Family Communication: No Family at Bedside Disposition Plan: SNF when Stable.    Consultants:   Nephrology  Procedures:  None  Antimicrobials: IV Ceftriaxone 11/02/2016 -->  Subjective: Seen and examined at bedside and he had significant back pain this AM and is frustrated that he is not getting better. Patient was still being diuresed and told to fluid restrict. Wants to know when he can go to the SNF. States breathing is "still bad" and says it was fine when he took po Lasix and thinks IV Lasix is making him worse. No other concerns or complaints at this time.   Objective: Vitals:   11/08/16 0435 11/08/16 0814 11/08/16 1255 11/08/16 1803  BP: (!) 104/59 110/62 (!) 95/56 98/63  Pulse: 70 70 68 69  Resp: 18  17   Temp: 97.5 F (36.4 C)  97.5 F (36.4 C)   TempSrc: Oral  Oral   SpO2: 97%  99%   Weight: 78.4 kg (172 lb 12.8 oz)     Height:        Intake/Output Summary (Last 24 hours) at 11/08/16 1935 Last data filed at 11/08/16 1400  Gross per 24 hour  Intake               483 ml  Output             2100 ml  Net            -1617 ml   Filed Weights   11/06/16 0520 11/07/16 0525 11/08/16 0435  Weight: 79.9 kg (176 lb 1.6 oz) 78.4 kg (172 lb 14.4 oz) 78.4 kg (172 lb 12.8 oz)   Examination: Physical Exam:  Constitutional: WN/WD, NAD and appears agitated Eyes: PERRL, Left Eye bruising noted, sclerae anicteric  ENMT: External Ears, Nose appear normal. Grossly normal hearing.  Neck: Appears normal, supple, no cervical masses, normal ROM, no appreciable thyromegaly. Chest Wall: Patient had tenderness on Right side from fall.  Respiratory: Diminished to auscultation bilaterally, no wheezing, rales, rhonchi or crackles. Normal respiratory effort and patient is not tachypenic. No accessory muscle use.  Cardiovascular: RRR, no murmurs / rubs / gallops. S1 and S2 auscultated. 1+ extremity pitting edema.  Abdomen: Soft, non-tender, non-distended. No masses palpated.  No appreciable hepatosplenomegaly. Bowel sounds positive.  GU: Deferred. Musculoskeletal: No clubbing / cyanosis of digits/nails. No joint deformity upper and lower extremities. Low Back pain tender to palpate.  Skin: No rashes, lesions, ulcers. No induration; Warm and Dry. Patient has bruising on Left eye, Right had has some abrasions.  Neurologic: CN 2-12 grossly intact with no focal deficits. Sensation intact in all 4 Extremities. Romberg sign cerebellar reflexes not assessed.  Psychiatric: Normal judgment and insight. Alert and oriented x 3. Agitated mood and appropriate affect.   Data Reviewed: I have personally reviewed following labs and imaging studies  CBC:  Recent Labs Lab 11/04/16 1043 11/05/16 0252 11/06/16 0342 11/07/16 0816 11/08/16 0344  WBC 6.8 7.8 7.4 6.3 6.7  NEUTROABS 4.4 5.4 4.6 3.6 4.1  HGB 10.1* 10.1* 9.4* 10.5* 10.2*  HCT 31.6* 32.8* 30.0* 32.9* 31.8*  MCV 77.1* 77.4* 75.9* 76.2* 76.3*  PLT 214 229 226 255 [id number]   Basic Metabolic Panel:  Recent Labs Lab  11/05/16 0252 11/05/16 1415 11/06/16 0342 11/07/16 0816 11/08/16 0344  NA 126* 122* 125* 123* 125*  K 5.1 5.3* 4.4 4.3 4.6  CL 91* 88* 90* 86* 88*  CO2 25 23 25 26 25   GLUCOSE 234* 343* 181* 203* 328*  BUN 27* 28* 26* 28* 34*  CREATININE 1.29* 1.49* 1.37* 1.41* 1.47*  CALCIUM 8.8* 8.8* 8.8* 9.2 8.9  MG 2.0  --  1.9 2.0 1.9  PHOS 3.0  --  3.2 2.8 3.5   GFR: Estimated Creatinine Clearance: 30.6 mL/min (by C-G formula based on SCr of 1.47 mg/dL (H)). Liver Function Tests:  Recent Labs Lab 11/03/16 0458 11/05/16 0252 11/06/16 0342 11/07/16 0816 11/08/16 0344  AST 25 19 19 26 25   ALT 22 17 15* 17 16*  ALKPHOS 122 119 118 139* 148*  BILITOT 1.3* 0.9 0.7 0.5 0.4  PROT 6.4* 6.6 6.7 6.9 7.1  ALBUMIN 3.4* 3.3* 3.1* 3.4* 3.4*   No results for input(s): LIPASE, AMYLASE in the last 168 hours. No results for input(s): AMMONIA in the last 168 hours. Coagulation Profile: No results for input(s): INR, PROTIME in the last 168 hours. Cardiac Enzymes:  Recent Labs Lab 11/02/16 1656 11/02/16 2234 11/03/16 0458  TROPONINI 0.14* 0.12* 0.10*   BNP (last 3 results) No results for input(s): PROBNP in the last 8760 hours. HbA1C: No results for input(s): HGBA1C in the last 72 hours. CBG:  Recent Labs Lab 11/07/16 2132 11/08/16 0615 11/08/16 0646 11/08/16 1109 11/08/16 1700  GLUCAP 221* 308* 284* 169* 189*   Lipid Profile: No results for input(s): CHOL, HDL, LDLCALC, TRIG, CHOLHDL, LDLDIRECT in the last 72 hours. Thyroid Function Tests: No results for input(s): TSH, T4TOTAL, FREET4, T3FREE, THYROIDAB in the last 72 hours. Anemia Panel: No results for input(s): VITAMINB12, FOLATE, FERRITIN, TIBC, IRON, RETICCTPCT in the last 72 hours. Sepsis Labs: No results for input(s): PROCALCITON, LATICACIDVEN in the last 168 hours.  Recent Results (from the past 240 hour(s))  Urine culture     Status: Abnormal   Collection Time: 11/02/16 10:27 AM  Result Value Ref Range Status    Specimen Description URINE, RANDOM  Final   Special Requests NONE  Final   Culture MULTIPLE SPECIES PRESENT, SUGGEST RECOLLECTION (A)  Final   Report Status 11/03/2016 FINAL  Final  Culture, Urine     Status: Abnormal   Collection Time: 11/02/16  7:08 PM  Result Value Ref Range Status   Specimen Description URINE, CLEAN CATCH  Final   Special Requests  NONE  Final   Culture MULTIPLE SPECIES PRESENT, SUGGEST RECOLLECTION (A)  Final   Report Status 11/03/2016 FINAL  Final    Radiology Studies: Dg Chest Port 1 View  Result Date: 11/08/2016 CLINICAL DATA:  Shortness of Breath EXAM: PORTABLE CHEST 1 VIEW COMPARISON:  November 06, 2016 FINDINGS: There is no edema or consolidation. Heart is enlarged with a degree of pulmonary venous hypertension. Defibrillator leads are attached the right atrium, right ventricle, and left ventricle. Aorta is somewhat prominent tortuous but stable. No adenopathy. There is a total shoulder replacement on the right. Patient is status post coronary artery bypass grafting. IMPRESSION: Findings indicative of a degree of pulmonary vascular congestion. No frank edema or consolidation. Electronically Signed   By: Lowella Grip III M.D.   On: 11/08/2016 07:51     Scheduled Meds: . amiodarone  200 mg Oral Daily  . aspirin  81 mg Oral Daily  . carvedilol  3.125 mg Oral BID WC  . cefTRIAXone (ROCEPHIN)  IV  1 g Intravenous Q24H  . fluconazole  200 mg Oral Daily  . furosemide  80 mg Intravenous BID  . heparin  5,000 Units Subcutaneous Q8H  . insulin aspart  0-20 Units Subcutaneous TID WC  . insulin glargine  7 Units Subcutaneous Daily  . nystatin   Topical BID  . pantoprazole  20 mg Oral Daily  . polyethylene glycol  17 g Oral Daily  . pravastatin  40 mg Oral q1800  . sodium chloride flush  3 mL Intravenous Q12H  . spironolactone  25 mg Oral Daily  . tamsulosin  0.4 mg Oral QPC supper   Continuous Infusions:   LOS: 5 days   Kerney Elbe, DO [facility] Pager [phone]  If 7PM-7AM, please contact night-coverage [email or web address] Password [facility] 11/08/2016, 7:35 PM

## 2016-11-08 NOTE — Care Management Important Message (Signed)
Important Message  Patient Details  Name: Rodney [name] MRN: [id number] Date of Birth: 05-14-1923   Medicare Important Message Given:  Yes    Nathen May 11/08/2016, 9:48 AM

## 2016-11-08 NOTE — Progress Notes (Signed)
Assessment:  1 Hypervolemic hyponatremia, improving 2 CKD 3 DM OOC  Plan: 1 Cont to diurese 2 Fluid restrict to 800cc/day  Subjective: Interval History: c/o left flank pain  Objective: Vital signs in last 24 hours: Temp:  [97.5 F (36.4 C)-97.7 F (36.5 C)] 97.5 F (36.4 C) (11/21 0435) Pulse Rate:  [70] 70 (11/21 0814) Resp:  [17-18] 18 (11/21 0435) BP: (99-110)/(52-62) 110/62 (11/21 0814) SpO2:  [96 %-98 %] 97 % (11/21 0435) Weight:  [78.4 kg (172 lb 12.8 oz)] 78.4 kg (172 lb 12.8 oz) (11/21 0435) Weight change: -0.045 kg (-1.6 oz)  Intake/Output from previous day: 11/20 0701 - 11/21 0700 In: 963 [P.O.:960; I.V.:3] Out: 2151 [Urine:2150; Stool:1] Intake/Output this shift: Total I/O In: 3 [I.V.:3] Out: 500 [Urine:500]  General appearance: alert and cooperative Chest wall: left sided chest wall tenderness GI: soft, non-tender; bowel sounds normal; no masses,  no organomegaly Extremities: edema 1+  Lab Results:  Recent Labs  11/07/16 0816 11/08/16 0344  WBC 6.3 6.7  HGB 10.5* 10.2*  HCT 32.9* 31.8*  PLT 255 256   BMET:  Recent Labs  11/07/16 0816 11/08/16 0344  NA 123* 125*  K 4.3 4.6  CL 86* 88*  CO2 26 25  GLUCOSE 203* 328*  BUN 28* 34*  CREATININE 1.41* 1.47*  CALCIUM 9.2 8.9   No results for input(s): PTH in the last 72 hours. Iron Studies: No results for input(s): IRON, TIBC, TRANSFERRIN, FERRITIN in the last 72 hours. Studies/Results: Dg Chest Port 1 View  Result Date: 11/08/2016 CLINICAL DATA:  Shortness of Breath EXAM: PORTABLE CHEST 1 VIEW COMPARISON:  November 06, 2016 FINDINGS: There is no edema or consolidation. Heart is enlarged with a degree of pulmonary venous hypertension. Defibrillator leads are attached the right atrium, right ventricle, and left ventricle. Aorta is somewhat prominent tortuous but stable. No adenopathy. There is a total shoulder replacement on the right. Patient is status post coronary artery bypass grafting.  IMPRESSION: Findings indicative of a degree of pulmonary vascular congestion. No frank edema or consolidation. Electronically Signed   By: Lowella Grip III M.D.   On: 11/08/2016 07:51    Scheduled: . amiodarone  200 mg Oral Daily  . aspirin  81 mg Oral Daily  . carvedilol  3.125 mg Oral BID WC  . cefTRIAXone (ROCEPHIN)  IV  1 g Intravenous Q24H  . fluconazole  200 mg Oral Daily  . furosemide  80 mg Intravenous BID  . heparin  5,000 Units Subcutaneous Q8H  . insulin aspart  0-20 Units Subcutaneous TID WC  . insulin glargine  7 Units Subcutaneous Daily  . nystatin   Topical BID  . pantoprazole  20 mg Oral Daily  . polyethylene glycol  17 g Oral Daily  . pravastatin  40 mg Oral q1800  . sodium chloride flush  3 mL Intravenous Q12H  . spironolactone  25 mg Oral Daily  . tamsulosin  0.4 mg Oral QPC supper     LOS: 5 days   Rodney [name] C 11/08/2016,12:37 PM

## 2016-11-08 NOTE — Progress Notes (Signed)
Pt CBG 291. No bedtime coverage ordered. Notified Schorr, NP. Order given to administer Novolog 4 units. Isac Caddy, RN

## 2016-11-08 NOTE — Progress Notes (Signed)
Inpatient Diabetes Program Recommendations  AACE/ADA: New Consensus Statement on Inpatient Glycemic Control (2015)  Target Ranges:  Prepandial:   less than 140 mg/dL      Peak postprandial:   less than 180 mg/dL (1-2 hours)      Critically ill patients:  140 - 180 mg/dL   Lab Results  Component Value Date   GLUCAP 284 (H) 11/08/2016    Review of Glycemic Control Results for Rodney [name], Rodney [name] (MRN [id number]) as of 11/08/2016 08:28  Ref. Range 11/07/2016 06:03 11/07/2016 11:21 11/07/2016 17:03 11/07/2016 21:32 11/08/2016 06:15 11/08/2016 06:46  Glucose-Capillary Latest Ref Range: 65 - 99 mg/dL 279 (H) 346 (H) 263 (H) 221 (H) 308 (H) 284 (H)   Diabetes history:DM2 Outpatient Diabetes medications: 70/30 insulin 30 units am +6 units pm Current orders for Inpatient glycemic control: Novolog correction 0-9 units tid  Inpatient Diabetes Program Recommendations: Noted last elevated CBGs. Please consider: -Increase Lantus to 10 units -Novolog 2-3 units meal coverage if eats 50% meals. Or Transition back to 70/30 if eating well.  Thank you, Nani Gasser. Byron Peacock, RN, MSN, CDE Inpatient Glycemic Control Team Team Pager [phone] (8am-5pm) 11/08/2016 8:28 AM

## 2016-11-09 ENCOUNTER — Inpatient Hospital Stay (HOSPITAL_COMMUNITY): Payer: Medicare Other

## 2016-11-09 DIAGNOSIS — I5023 Acute on chronic systolic (congestive) heart failure: Secondary | ICD-10-CM

## 2016-11-09 LAB — CBC WITH DIFFERENTIAL/PLATELET
Basophils Absolute: 0 10*3/uL (ref 0.0–0.1)
Basophils Relative: 0 %
EOS PCT: 4 %
Eosinophils Absolute: 0.3 10*3/uL (ref 0.0–0.7)
HEMATOCRIT: 35.5 % — AB (ref 39.0–52.0)
Hemoglobin: 11.1 g/dL — ABNORMAL LOW (ref 13.0–17.0)
LYMPHS ABS: 2 10*3/uL (ref 0.7–4.0)
Lymphocytes Relative: 26 %
MCH: 23.9 pg — AB (ref 26.0–34.0)
MCHC: 31.3 g/dL (ref 30.0–36.0)
MCV: 76.5 fL — AB (ref 78.0–100.0)
MONO ABS: 0.9 10*3/uL (ref 0.1–1.0)
Monocytes Relative: 11 %
Neutro Abs: 4.6 10*3/uL (ref 1.7–7.7)
Neutrophils Relative %: 59 %
PLATELETS: 291 10*3/uL (ref 150–400)
RBC: 4.64 MIL/uL (ref 4.22–5.81)
RDW: 18.4 % — ABNORMAL HIGH (ref 11.5–15.5)
WBC: 7.9 10*3/uL (ref 4.0–10.5)

## 2016-11-09 LAB — COMPREHENSIVE METABOLIC PANEL
ALT: 20 U/L (ref 17–63)
AST: 27 U/L (ref 15–41)
Albumin: 3.6 g/dL (ref 3.5–5.0)
Alkaline Phosphatase: 147 U/L — ABNORMAL HIGH (ref 38–126)
Anion gap: 11 (ref 5–15)
BILIRUBIN TOTAL: 0.6 mg/dL (ref 0.3–1.2)
BUN: 35 mg/dL — ABNORMAL HIGH (ref 6–20)
CALCIUM: 9.6 mg/dL (ref 8.9–10.3)
CHLORIDE: 91 mmol/L — AB (ref 101–111)
CO2: 29 mmol/L (ref 22–32)
CREATININE: 1.44 mg/dL — AB (ref 0.61–1.24)
GFR, EST AFRICAN AMERICAN: 47 mL/min — AB (ref >60)
GFR, EST NON AFRICAN AMERICAN: 41 mL/min — AB (ref >60)
Glucose, Bld: 179 mg/dL — ABNORMAL HIGH (ref 65–99)
Potassium: 4.2 mmol/L (ref 3.5–5.1)
Sodium: 131 mmol/L — ABNORMAL LOW (ref 135–145)
TOTAL PROTEIN: 7.6 g/dL (ref 6.5–8.1)

## 2016-11-09 LAB — PHOSPHORUS: Phosphorus: 3.8 mg/dL (ref 2.5–4.6)

## 2016-11-09 LAB — MAGNESIUM: MAGNESIUM: 2.2 mg/dL (ref 1.7–2.4)

## 2016-11-09 LAB — GLUCOSE, CAPILLARY
Glucose-Capillary: 223 mg/dL — ABNORMAL HIGH (ref 65–99)
Glucose-Capillary: 388 mg/dL — ABNORMAL HIGH (ref 65–99)
Glucose-Capillary: 193 mg/dL — ABNORMAL HIGH (ref 65–99)

## 2016-11-09 MED ORDER — POLYETHYLENE GLYCOL 3350 17 G PO PACK: 17.0000 g | PACK | Freq: Two times a day (BID) | ORAL | Status: DC

## 2016-11-09 MED ORDER — BISACODYL 10 MG RE SUPP
10.0000 mg | Freq: Every day | RECTAL | Status: DC | PRN
  Administered 2016-11-09: 10 mg via RECTAL
  Filled 2016-11-09: qty 1

## 2016-11-09 MED ORDER — INSULIN GLARGINE 100 UNIT/ML ~~LOC~~ SOLN: 15.0000 [IU] | Freq: Every day | SUBCUTANEOUS | 11 refills | Status: DC

## 2016-11-09 MED ORDER — SPIRONOLACTONE 25 MG PO TABS: 25.0000 mg | ORAL_TABLET | Freq: Every day | ORAL | 0 refills | Status: DC

## 2016-11-09 MED ORDER — CEPHALEXIN 500 MG PO CAPS
500.0000 mg | ORAL_CAPSULE | Freq: Two times a day (BID) | ORAL | Status: DC
  Administered 2016-11-09: 500 mg via ORAL
  Filled 2016-11-09: qty 1

## 2016-11-09 MED ORDER — PANTOPRAZOLE SODIUM 20 MG PO TBEC: 20.0000 mg | DELAYED_RELEASE_TABLET | Freq: Every day | ORAL | 0 refills | Status: AC

## 2016-11-09 MED ORDER — CEPHALEXIN 500 MG PO CAPS: 500.0000 mg | ORAL_CAPSULE | Freq: Two times a day (BID) | ORAL | 0 refills | Status: DC

## 2016-11-09 MED ORDER — NYSTATIN 100000 UNIT/GM EX POWD: Freq: Two times a day (BID) | CUTANEOUS | 0 refills | Status: DC

## 2016-11-09 MED ORDER — SENNOSIDES-DOCUSATE SODIUM 8.6-50 MG PO TABS
2.0000 | ORAL_TABLET | Freq: Every evening | ORAL | 0 refills | Status: AC | PRN
Start: 2016-11-09 — End: ?

## 2016-11-09 MED ORDER — TORSEMIDE 20 MG PO TABS
40.0000 mg | ORAL_TABLET | Freq: Two times a day (BID) | ORAL | Status: DC
  Administered 2016-11-09: 40 mg via ORAL
  Filled 2016-11-09: qty 2

## 2016-11-09 MED ORDER — TORSEMIDE 20 MG PO TABS: 40.0000 mg | ORAL_TABLET | Freq: Two times a day (BID) | ORAL | 0 refills | Status: DC

## 2016-11-09 MED ORDER — TAMSULOSIN HCL 0.4 MG PO CAPS: 0.4000 mg | ORAL_CAPSULE | Freq: Every day | ORAL | 0 refills | Status: AC

## 2016-11-09 MED ORDER — POLYETHYLENE GLYCOL 3350 17 G PO PACK: 17.0000 g | PACK | Freq: Two times a day (BID) | ORAL | 0 refills | Status: AC

## 2016-11-09 MED ORDER — BISACODYL 10 MG RE SUPP: 10.0000 mg | Freq: Every day | RECTAL | 0 refills | Status: DC | PRN

## 2016-11-09 NOTE — Progress Notes (Signed)
Pt/family given discharge instructions, medication lists, follow up appointments, and when to call the doctor.  Pt/family verbalizes understanding. Assisted family getting patient into private vehicle for discharge to [facility], [place], [place]. Payton Emerald, RN

## 2016-11-09 NOTE — Care Management Note (Signed)
Case Management Note Marvetta Gibbons RN, BSN Unit 2W-Case Manager [phone]  Patient Details  Name: Rodney [name] MRN: [id number] Date of Birth: 01/31/1923  Subjective/Objective:  Pt admitted with pyelonephritis                   Action/Plan: PTA pt lived at home, PT recommendation for SNF- CSW following for placement- pt would like [facility] in [place] when medically stable for discharge.   Expected Discharge Date: 11/09/16                 Expected Discharge Plan:   skilled facility  In-House Referral:   social work  Discharge planning Services     Post Acute Care Choice:    Choice offered to:     DME Arranged:    DME Agency:     HH Arranged:    [place] Agency:     Status of Service:   completed  If discussed at [employer], dates discussed:    Additional Comments:  Dawayne Patricia, RN 11/09/2016, 12:30 PM

## 2016-11-09 NOTE — Progress Notes (Signed)
Assessment:  1 Hypervolemic hyponatremia, improving 2 CKD 3 DM OOC  Plan: 1 Cont to diurese, can change to PO diuretics 2 Fluid restrict 1000 to 800cc/day 3 We will sign off  Subjective: Interval History: Back still hurts  Objective: Vital signs in last 24 hours: Temp:  [97.5 F (36.4 C)] 97.5 F (36.4 C) (11/22 0515) Pulse Rate:  [68-71] 71 (11/22 0515) Resp:  [17-18] 18 (11/22 0515) BP: (95-126)/(56-92) 126/92 (11/22 0515) SpO2:  [99 %-100 %] 100 % (11/22 0515) Weight:  [76 kg (167 lb 8 oz)] 76 kg (167 lb 8 oz) (11/22 0515) Weight change: -2.404 kg (-5 lb 4.8 oz)  Intake/Output from previous day: 11/21 0701 - 11/22 0700 In: 603 [P.O.:600; I.V.:3] Out: 1625 [Urine:1625] Intake/Output this shift: No intake/output data recorded.  General appearance: alert and cooperative Back: tender left lower rib region Resp: clear to auscultation bilaterally Cardio: regular rate and rhythm, S1, S2 normal, no murmur, click, rub or gallop Extremities: edema 1+  Lab Results:  Recent Labs  11/08/16 0344 11/09/16 0345  WBC 6.7 7.9  HGB 10.2* 11.1*  HCT 31.8* 35.5*  PLT 256 291   BMET:  Recent Labs  11/08/16 0344 11/09/16 0345  NA 125* 131*  K 4.6 4.2  CL 88* 91*  CO2 25 29  GLUCOSE 328* 179*  BUN 34* 35*  CREATININE 1.47* 1.44*  CALCIUM 8.9 9.6   No results for input(s): PTH in the last 72 hours. Iron Studies: No results for input(s): IRON, TIBC, TRANSFERRIN, FERRITIN in the last 72 hours. Studies/Results: Dg Chest Port 1 View  Result Date: 11/08/2016 CLINICAL DATA:  Shortness of Breath EXAM: PORTABLE CHEST 1 VIEW COMPARISON:  November 06, 2016 FINDINGS: There is no edema or consolidation. Heart is enlarged with a degree of pulmonary venous hypertension. Defibrillator leads are attached the right atrium, right ventricle, and left ventricle. Aorta is somewhat prominent tortuous but stable. No adenopathy. There is a total shoulder replacement on the right. Patient  is status post coronary artery bypass grafting. IMPRESSION: Findings indicative of a degree of pulmonary vascular congestion. No frank edema or consolidation. Electronically Signed   By: Lowella Grip III M.D.   On: 11/08/2016 07:51    Scheduled: . amiodarone  200 mg Oral Daily  . aspirin  81 mg Oral Daily  . carvedilol  3.125 mg Oral BID WC  . cefTRIAXone (ROCEPHIN)  IV  1 g Intravenous Q24H  . fluconazole  200 mg Oral Daily  . furosemide  80 mg Intravenous BID  . heparin  5,000 Units Subcutaneous Q8H  . insulin aspart  0-20 Units Subcutaneous TID WC  . insulin glargine  10 Units Subcutaneous Daily  . nystatin   Topical BID  . pantoprazole  20 mg Oral Daily  . polyethylene glycol  17 g Oral Daily  . pravastatin  40 mg Oral q1800  . sodium chloride flush  3 mL Intravenous Q12H  . spironolactone  25 mg Oral Daily  . tamsulosin  0.4 mg Oral QPC supper    LOS: 6 days   Jameica Couts C 11/09/2016,7:15 AM

## 2016-11-09 NOTE — Clinical Social Work Note (Addendum)
Clinical Social Worker facilitated patient discharge including contacting patient family and facility to confirm patient discharge plans.  Clinical information faxed to facility and family agreeable with plan.  CSW arranged transport via patient son, Purcell Nails to [facility].  RN to call report prior to discharge.  Clinical Social Worker will sign off for now as social work intervention is no longer needed. Please consult [place] again if new need arises.  Barbette Or, [place]

## 2016-11-09 NOTE — Clinical Social Work Placement (Signed)
   CLINICAL SOCIAL WORK PLACEMENT  NOTE  Date:  11/09/2016  Patient Details  Name: DOYEL TALBOT MRN: [id number] Date of Birth: 1923/09/17  Clinical Social Work is seeking post-discharge placement for this patient at the [place] level of care (*CSW will initial, date and re-position this form in  chart as items are completed):  Yes   Patient/family provided with [place] Work Department's list of facilities offering this level of care within the geographic area requested by the patient (or if unable, by the patient's family).  Yes   Patient/family informed of their freedom to choose among providers that offer the needed level of care, that participate in Medicare, Medicaid or managed care program needed by the patient, have an available bed and are willing to accept the patient.  No   Patient/family informed of [place]'s ownership interest in [facility] and [facility], as well as of the fact that they are under no obligation to receive care at these facilities.  PASRR submitted to EDS on 11/06/16     PASRR number received on       Existing PASRR number confirmed on 11/06/16     FL2 transmitted to all facilities in geographic area requested by pt/family on       FL2 transmitted to all facilities within larger geographic area on 11/06/16     Patient informed that his/her managed care company has contracts with or will negotiate with certain facilities, including the following:        Yes   Patient/family informed of bed offers received.  Patient chooses bed at [facility] and [facility]     Physician recommends and patient chooses bed at [facility] and [place]    Patient to be transferred to [facility] and [facility] on  .  Patient to be transferred to facility by patient son     Patient family notified on 11/09/16 of transfer.  Name of family member notified:  Pellegrino Baine      PHYSICIAN Please sign FL2     Additional Comment:    Barbette Or, [place]

## 2016-11-09 NOTE — Discharge Summary (Signed)
Physician Discharge Summary  NAKITA APO [id number] DOB: [birth year] DOA: 11/02/2016  PCP: Mitzie Na, MD  Admit date: 11/02/2016 Discharge date: 11/09/2016  Admitted From: home  Disposition:  SNF  Recommendations for Outpatient Follow-up:  1. Follow up with PCP in 1-2 weeks 2. Please obtain BMP/CBC in one week 3. Please repeat Labs to follow renal function, adjust torsemide as needed. He will be discharge on higher dose of torsemide, attention to electrolytes.     Discharge Condition: Stable.  CODE STATUS: full code.  Diet recommendation: Heart Healthy / Carb Modified   Brief/Interim Summary: Rodney [name] is a 82 y.o.malewith medical history significant of chronic systolic CHF, paroxysmal atrial fibrillation, diabetes mellitus, hypertension, hyperlipidemia, chronic kidney disease stage III, prior nephrolithiasis presents to the emergency room with chief complaint of left flank and back pain. He was admitted with concerns for UTI / Pyelopnephritis as well as Acute Decompensation of Chronic Systolic CHF. Patient is currently being diuresed and improving and states his back pain is also improving. Discussed with Social Worker yesterday and she is going to discuss placement options with the patient for [place]. Today patient's Na+ dropped even lower so I spoke with Dr. Erling Cruz of Nephrology who will come evaluate the patient for his Hyponatremia. Patient is slowly improving with diuresis but however still complains of significant Back Pain and stated it "felt like a kidney stone."  Assessment & Plan:    Acute Decompensation of Chronic Systolic CHF with last EF of 30% - BNP on Admission was 1,885.7 - Most recent 2-D echo was done at [facility] in June 2017 and showed ejection fraction of 30%. -Will not repeat a 2-D echo this visit - C/w Carvedilol 3.125 mg po BID, C/w Spironolactone 25 mg po Daily received  IV 80 mg BID per Nephrology. Plan to  discharge on torsemide.  - Strict I's and O's, Daily Weights, Fluid Restriction now to 800 mL/day, SLIV - Patient's Weight went from 177 >> 171 >> 174 >> 176 >> 172 >> 172  lbs - Total Output since admit is -8.2 Liters - Findings on CXR today showed Findings indicative of a degree of pulmonary vascular congestion. No frank edema or consolidation.  Concern for Pyelonephritis UTI / Flank pain - Urine cultures and showed Multiple Species Present, however he has been on ciprofloxacin and cultures may remain negative.  - C/w IV Ceftriaxone for now (Day 7). - discharge on 3 days more of keflex.  -Patient has CVA tenderness on exam still. Pain may be superimposed  MSKpain -ct negative for kidney stone.   Likely Hypervolemic Hyponatremia - Clinically appears fluid overloaded. - Urine Osmolality was 252, Urine Sodium was 59, Urine Urea Nitrogen pending - improved on diuretics/   Atrial Fibrillation - Patient's CHA2DS2-VASc Score for Stroke Risk is at least 5. - TSH was 8.173 - He has been on Coumadin at one point in the past, however had what seems like a GI bleed and he was taken off of anticoagulation. Continue aspirin. - Continue Amiodarone 200 mg po Daily as well as Carvedilol 3.125 mg po BID  Diabetes Mellitus Type 2 - BS on BMP was 203 - C/w Resistant Novolog SSI - Increase Lantus 15 units.    Acute Kidney Injury on Chronic Kidney Disease stage III, improving - Baseline creatinine around 1.3-1.4based on [facility] records, ithasincreased to 2.1 on admission likely multifactorial in the setting of diabetes, hypertension,? Component of cardiorenal syndrome given long-standing CHF.  - BUN/Cr went from 40/1.77 ->  37/1.56 -> 27/1.29 -> 26/1.37 -> 28/1.41 -> 34/1.47 - IV Lasix increased to 80 mg BID per Nephrology, change to torsemide 40 mg BID. Discussed with nephrology.  Needs to monitor kidney function, needs labs in 48 hours   Constipation, had BM 2 days ago  -C/w Senna-Docusate  2 tablet po qHS prn and Miralax 17 g -recommend suppository, patient wants oral laxatives. Change miralax to BID  Fall -PT Recommends SNF; Social Work [employer] for [employer].   Coronary Artery Disease -Patient had a recent evaluation in June 2017 at [facility] with a myocardial perfusion scan, without any reversible perfusion defects. Has no chest pain.  - Continue Aspirin, Beta blocker and Statin - Slight troponin elevation likely due to Heart Failure Exacerbation, cardiac enzymes flat (0.14 -> 0.12 -> 0.10)  Hypertension -Decreased Coreg dose to 3.125 mg po BID  was receiving IV Lasix and had lower BP's - Continue to Monitor VS   Hyperlipidemia - Continue Pravastatin 40 mg po Daily  GERD - C/w Pantoprazole 40 mg po Daily   Discharge Diagnoses:  Active Problems:   Pyelonephritis   Acute on chronic systolic CHF (congestive heart failure) (HCC)   DM (diabetes mellitus) (HCC)   Atrial fibrillation (HCC)   HTN (hypertension)   HLD (hyperlipidemia)   ICD (implantable cardioverter-defibrillator) in place   CKD (chronic kidney disease) stage 3, GFR 30-59 ml/min   Hyponatremia   CAD (coronary artery disease)   Shortness of breath    Discharge Instructions  Discharge Instructions    Diet - low sodium heart healthy    Complete by:  As directed    Increase activity slowly    Complete by:  As directed        Medication List    STOP taking these medications   ciprofloxacin 500 MG tablet Commonly known as:  CIPRO   diclofenac 75 MG EC tablet Commonly known as:  VOLTAREN   NOVOLIN 70/30 (70-30) 100 UNIT/ML injection Generic drug:  insulin NPH-regular Human     TAKE these medications   acetaminophen 325 MG tablet Commonly known as:  TYLENOL Take 325-975 mg by mouth 2 (two) times daily as needed (for "body pains/aches").   amiodarone 200 MG tablet Commonly known as:  PACERONE Take 200 mg by mouth daily.   aspirin EC 325 MG tablet Take 162.5 mg by mouth  daily.   bisacodyl 10 MG suppository Commonly known as:  DULCOLAX Place 1 suppository (10 mg total) rectally daily as needed for moderate constipation.   carvedilol 25 MG tablet Commonly known as:  COREG Take 12.5 mg by mouth 2 (two) times daily with a meal.   cephALEXin 500 MG capsule Commonly known as:  KEFLEX Take 1 capsule (500 mg total) by mouth every 12 (twelve) hours.   glucose 4 GM chewable tablet Chew 2 tablets by mouth once as needed for low blood sugar.   insulin glargine 100 UNIT/ML injection Commonly known as:  LANTUS Inject 0.15 mLs (15 Units total) into the skin daily. Start taking on:  11/10/2016   levothyroxine 125 MCG tablet Commonly known as:  SYNTHROID, LEVOTHROID Take 125 mcg by mouth daily before breakfast.   nystatin powder Commonly known as:  MYCOSTATIN/NYSTOP Apply topically 2 (two) times daily.   pantoprazole 20 MG tablet Commonly known as:  PROTONIX Take 1 tablet (20 mg total) by mouth daily. Start taking on:  11/10/2016   polyethylene glycol packet Commonly known as:  MIRALAX / GLYCOLAX Take 17 g by mouth 2 (  two) times daily.   pravastatin 40 MG tablet Commonly known as:  PRAVACHOL Take 20 mg by mouth every evening.   senna-docusate 8.6-50 MG tablet Commonly known as:  Senokot-S Take 2 tablets by mouth at bedtime as needed for mild constipation.   spironolactone 25 MG tablet Commonly known as:  ALDACTONE Take 1 tablet (25 mg total) by mouth daily. Start taking on:  11/10/2016 What changed:  how much to take   tamsulosin 0.4 MG Caps capsule Commonly known as:  FLOMAX Take 1 capsule (0.4 mg total) by mouth daily after supper.   torsemide 20 MG tablet Commonly known as:  DEMADEX Take 2 tablets (40 mg total) by mouth 2 (two) times daily. What changed:  how much to take   triamcinolone cream 0.5 % Commonly known as:  KENALOG Apply 1 application topically 2 (two) times daily. TO AFFECTED AREAS       Allergies  Allergen  Reactions  . Tape Other (See Comments)    SKIN IS VERY THIN AND WILL TEAR & BRUISE EASILY!!  . Sulfa Antibiotics Rash    Consultations:  Nephrology    Procedures/Studies: Ct Abdomen Pelvis Wo Contrast  Result Date: 11/09/2016 CLINICAL DATA:  82 year old male with a history of left flank pain EXAM: CT ABDOMEN AND PELVIS WITHOUT CONTRAST TECHNIQUE: Multidetector CT imaging of the abdomen and pelvis was performed following the standard protocol without IV contrast. COMPARISON:  None. FINDINGS: Lower chest: Scarring/atelectasis at the lung bases. Native coronary artery disease. Surgical changes of median sternotomy. Pacing leads partially imaged. No pericardial fluid/ thickening. Hepatobiliary: Relatively increased density of liver. No focal lesion. No evidence of cirrhotic changes.High density material within the gallbladder. No pericholecystic fluid or inflammatory changes. Pancreas: Atrophic pancreas parenchyma.  No inflammatory changes. Spleen: Unremarkable spleen Adrenals/Urinary Tract: Right adrenal gland unremarkable.  Left adrenal gland unremarkable. Right kidney: Thinning of the right renal cortex. No hydronephrosis or nephrolithiasis. Left Kidney: Thinning of the left renal cortex. Low-density cystic lesion at the superior left kidney measures 3 cm, most likely simple cyst though is incompletely characterized. No left-sided hydronephrosis or nephrolithiasis. Minimal perinephric stranding bilaterally. Course of the bilateral ureters unremarkable. Partial distention of urinary bladder, with small outpouchings likely E diverticula. Stomach/Bowel: Unremarkable appearance of stomach and small bowel. No transition point. Appendix is not visualized, however, no inflammatory changes are present adjacent to the cecum to indicate an appendicitis. Diverticular changes of the colon. No associated inflammatory changes. Moderate stool burden. Vascular/Lymphatic: Calcifications of the abdominal aorta. No  aneurysm. Reproductive: Brachytherapy of the prostate Other: Bilateral fact containing inguinal hernia. Small umbilical hernia containing short-segment of bowel. Musculoskeletal: No displaced fracture. Multilevel degenerative changes of thoracolumbar spine. Disc bulge and ligamentum flavum thickening at the L3-L4 level results in canal narrowing. Degenerative changes of the hips. IMPRESSION: No acute finding to account for the patient's flank pain. Specifically, no nephrolithiasis or hydronephrosis. Diffusely increased density of liver parenchyma, which is nonspecific though can be seen in the setting of chronic amiodarone prescription. Correlation with patient history and lab values may be useful. Moderate stool burden with no evidence of obstruction. Diverticular disease without evidence of acute diverticulitis. Degenerative disc changes throughout the lumbar spine. Worst level of disease at L3-L4. Aortic atherosclerosis. Signed, Dulcy Fanny. Earleen Newport, DO [facility] Electronically Signed   By: Corrie Mckusick D.O.   On: 11/09/2016 11:59   Dg Chest 2 View  Result Date: 11/02/2016 CLINICAL DATA:  Dizziness and weakness.  Status post  fall yesterday. EXAM: CHEST  2 VIEW COMPARISON:  None. FINDINGS: The patient is status post CABG with a pacing device in place. There is marked cardiomegaly but no pulmonary edema. No consolidative process, pneumothorax or effusion. Mid and lower thoracic compression fractures appear remote. IMPRESSION: Cardiomegaly without acute disease. Electronically Signed   By: Inge Rise M.D.   On: 11/02/2016 14:47   Ct Head Wo Contrast  Result Date: 11/02/2016 CLINICAL DATA:  Status post fall today. Bruising about the right eye. Initial encounter. EXAM: CT HEAD WITHOUT CONTRAST TECHNIQUE: Contiguous axial images were obtained from the base of the skull through the vertex without intravenous contrast. COMPARISON:  None. FINDINGS:  Brain: Atrophy and chronic microvascular ischemic change are seen. No evidence of acute abnormality including hemorrhage, infarct, mass lesion, mass effect, midline shift or abnormal extra-axial fluid collection. No hydrocephalus or pneumocephalus. Vascular: Extensive atherosclerotic vascular disease is seen. Skull: Intact. Sinuses/Orbits: Unremarkable. Other: None. IMPRESSION: No acute abnormality. Atrophy and chronic microvascular ischemic change. Atherosclerosis. Electronically Signed   By: Inge Rise M.D.   On: 11/02/2016 15:02   Dg Chest Port 1 View  Result Date: 11/08/2016 CLINICAL DATA:  Shortness of Breath EXAM: PORTABLE CHEST 1 VIEW COMPARISON:  November 06, 2016 FINDINGS: There is no edema or consolidation. Heart is enlarged with a degree of pulmonary venous hypertension. Defibrillator leads are attached the right atrium, right ventricle, and left ventricle. Aorta is somewhat prominent tortuous but stable. No adenopathy. There is a total shoulder replacement on the right. Patient is status post coronary artery bypass grafting. IMPRESSION: Findings indicative of a degree of pulmonary vascular congestion. No frank edema or consolidation. Electronically Signed   By: Lowella Grip III M.D.   On: 11/08/2016 07:51   Dg Chest Port 1 View  Result Date: 11/06/2016 CLINICAL DATA:  Shortness of breath. EXAM: PORTABLE CHEST 1 VIEW COMPARISON:  November 02, 2016 FINDINGS: Stable cardiomegaly and AICD device. The hila and mediastinum are unchanged. No pulmonary nodules, masses, or infiltrates. No overt edema. IMPRESSION: Stable cardiomegaly without overt edema. Electronically Signed   By: Dorise Bullion III M.D   On: 11/06/2016 10:11   Dg Knee Complete 4 Views Left  Result Date: 11/02/2016 CLINICAL DATA:  Fall EXAM: LEFT KNEE - COMPLETE 4+ VIEW COMPARISON:  None. FINDINGS: No acute fracture. No dislocation. Unremarkable soft tissues. Advanced tricompartment osteoarthritic change. IMPRESSION: No  acute bony pathology. Electronically Signed   By: Marybelle Killings M.D.   On: 11/02/2016 14:47       Subjective: Still complaining of back pain, think he has UTI,   Discharge Exam: Vitals:   11/08/16 2118 11/09/16 0515  BP: 112/60 (!) 126/92  Pulse: 70 71  Resp: 18 18  Temp: 97.5 F (36.4 C) 97.5 F (36.4 C)   Vitals:   11/08/16 1255 11/08/16 1803 11/08/16 2118 11/09/16 0515  BP: (!) 95/56 98/63 112/60 (!) 126/92  Pulse: 68 69 70 71  Resp: 17  18 18   Temp: 97.5 F (36.4 C)  97.5 F (36.4 C) 97.5 F (36.4 C)  TempSrc: Oral  Oral Oral  SpO2: 99%  99% 100%  Weight:    76 kg (167 lb 8 oz)  Height:        General: Pt is alert, awake, not in acute distress Cardiovascular: RRR, S1/S2 +, no rubs, no gallops Respiratory: CTA bilaterally, no wheezing, no rhonchi Abdominal: Soft, NT, ND, bowel sounds + Extremities: no edema, no cyanosis    The results of significant diagnostics  from this hospitalization (including imaging, microbiology, ancillary and laboratory) are listed below for reference.     Microbiology: Recent Results (from the past 240 hour(s))  Urine culture     Status: Abnormal   Collection Time: 11/02/16 10:27 AM  Result Value Ref Range Status   Specimen Description URINE, RANDOM  Final   Special Requests NONE  Final   Culture MULTIPLE SPECIES PRESENT, SUGGEST RECOLLECTION (A)  Final   Report Status 11/03/2016 FINAL  Final  Culture, Urine     Status: Abnormal   Collection Time: 11/02/16  7:08 PM  Result Value Ref Range Status   Specimen Description URINE, CLEAN CATCH  Final   Special Requests NONE  Final   Culture MULTIPLE SPECIES PRESENT, SUGGEST RECOLLECTION (A)  Final   Report Status 11/03/2016 FINAL  Final     Labs: BNP (last 3 results)  Recent Labs  11/02/16 1430  BNP [id number]   Basic Metabolic Panel:  Recent Labs Lab 11/05/16 0252 11/05/16 1415 11/06/16 0342 11/07/16 0816 11/08/16 0344 11/09/16 0345  NA 126* 122* 125* 123* 125* 131*   K 5.1 5.3* 4.4 4.3 4.6 4.2  CL 91* 88* 90* 86* 88* 91*  CO2 25 23 25 26 25 29   GLUCOSE 234* 343* 181* 203* 328* 179*  BUN 27* 28* 26* 28* 34* 35*  CREATININE 1.29* 1.49* 1.37* 1.41* 1.47* 1.44*  CALCIUM 8.8* 8.8* 8.8* 9.2 8.9 9.6  MG 2.0  --  1.9 2.0 1.9 2.2  PHOS 3.0  --  3.2 2.8 3.5 3.8   Liver Function Tests:  Recent Labs Lab 11/05/16 0252 11/06/16 0342 11/07/16 0816 11/08/16 0344 11/09/16 0345  AST 19 19 26 25 27   ALT 17 15* 17 16* 20  ALKPHOS 119 118 139* 148* 147*  BILITOT 0.9 0.7 0.5 0.4 0.6  PROT 6.6 6.7 6.9 7.1 7.6  ALBUMIN 3.3* 3.1* 3.4* 3.4* 3.6   No results for input(s): LIPASE, AMYLASE in the last 168 hours. No results for input(s): AMMONIA in the last 168 hours. CBC:  Recent Labs Lab 11/05/16 0252 11/06/16 0342 11/07/16 0816 11/08/16 0344 11/09/16 0345  WBC 7.8 7.4 6.3 6.7 7.9  NEUTROABS 5.4 4.6 3.6 4.1 4.6  HGB 10.1* 9.4* 10.5* 10.2* 11.1*  HCT 32.8* 30.0* 32.9* 31.8* 35.5*  MCV 77.4* 75.9* 76.2* 76.3* 76.5*  PLT 229 226 255 256 291   Cardiac Enzymes:  Recent Labs Lab 11/02/16 1656 11/02/16 2234 11/03/16 0458  TROPONINI 0.14* 0.12* 0.10*   BNP: Invalid input(s): POCBNP CBG:  Recent Labs Lab 11/08/16 1109 11/08/16 1700 11/08/16 2059 11/09/16 0634 11/09/16 1107  GLUCAP 169* 189* 291* 223* 388*   D-Dimer No results for input(s): DDIMER in the last 72 hours. Hgb A1c No results for input(s): HGBA1C in the last 72 hours. Lipid Profile No results for input(s): CHOL, HDL, LDLCALC, TRIG, CHOLHDL, LDLDIRECT in the last 72 hours. Thyroid function studies No results for input(s): TSH, T4TOTAL, T3FREE, THYROIDAB in the last 72 hours.  Invalid input(s): FREET3 Anemia work up No results for input(s): VITAMINB12, FOLATE, FERRITIN, TIBC, IRON, RETICCTPCT in the last 72 hours. Urinalysis    Component Value Date/Time   COLORURINE YELLOW 11/02/2016 1027   APPEARANCEUR HAZY (A) 11/02/2016 1027   LABSPEC 1.019 11/02/2016 1027   PHURINE  5.5 11/02/2016 1027   GLUCOSEU 250 (A) 11/02/2016 1027   HGBUR TRACE (A) 11/02/2016 [place] 11/02/2016 1027   [place] 11/02/2016 1027   PROTEINUR NEGATIVE 11/02/2016 1027   NITRITE  NEGATIVE 11/02/2016 1027   LEUKOCYTESUR MODERATE (A) 11/02/2016 1027   Sepsis Labs Invalid input(s): PROCALCITONIN,  WBC,  LACTICIDVEN Microbiology Recent Results (from the past 240 hour(s))  Urine culture     Status: Abnormal   Collection Time: 11/02/16 10:27 AM  Result Value Ref Range Status   Specimen Description URINE, RANDOM  Final   Special Requests NONE  Final   Culture MULTIPLE SPECIES PRESENT, SUGGEST RECOLLECTION (A)  Final   Report Status 11/03/2016 FINAL  Final  Culture, Urine     Status: Abnormal   Collection Time: 11/02/16  7:08 PM  Result Value Ref Range Status   Specimen Description URINE, CLEAN CATCH  Final   Special Requests NONE  Final   Culture MULTIPLE SPECIES PRESENT, SUGGEST RECOLLECTION (A)  Final   Report Status 11/03/2016 FINAL  Final     Time coordinating discharge: Over 30 minutes  SIGNED:   Elmarie Shiley, MD  [facility] 11/09/2016, 1:33 PM Pager [phone]  If 7PM-7AM, please contact night-coverage [email or web address] Password [email or web address]

## 2017-12-02 ENCOUNTER — Encounter (HOSPITAL_COMMUNITY): Payer: Self-pay

## 2017-12-02 ENCOUNTER — Inpatient Hospital Stay (HOSPITAL_COMMUNITY)
Admission: EM | Admit: 2017-12-02 | Discharge: 2017-12-04 | DRG: 194 | Disposition: A | Discharge status: Hospice | Attending: Family Medicine | Admitting: Family Medicine

## 2017-12-02 ENCOUNTER — Emergency Department (HOSPITAL_COMMUNITY)

## 2017-12-02 ENCOUNTER — Other Ambulatory Visit: Payer: Self-pay

## 2017-12-02 DIAGNOSIS — M1990 Unspecified osteoarthritis, unspecified site: Secondary | ICD-10-CM | POA: Diagnosis present

## 2017-12-02 DIAGNOSIS — K8020 Calculus of gallbladder without cholecystitis without obstruction: Secondary | ICD-10-CM | POA: Diagnosis present

## 2017-12-02 DIAGNOSIS — I509 Heart failure, unspecified: Secondary | ICD-10-CM

## 2017-12-02 DIAGNOSIS — L8990 Pressure ulcer of unspecified site, unspecified stage: Secondary | ICD-10-CM | POA: Diagnosis present

## 2017-12-02 DIAGNOSIS — Z96611 Presence of right artificial shoulder joint: Secondary | ICD-10-CM | POA: Diagnosis present

## 2017-12-02 DIAGNOSIS — I10 Essential (primary) hypertension: Secondary | ICD-10-CM | POA: Diagnosis present

## 2017-12-02 DIAGNOSIS — Z79899 Other long term (current) drug therapy: Secondary | ICD-10-CM

## 2017-12-02 DIAGNOSIS — Z9842 Cataract extraction status, left eye: Secondary | ICD-10-CM

## 2017-12-02 DIAGNOSIS — Z882 Allergy status to sulfonamides status: Secondary | ICD-10-CM

## 2017-12-02 DIAGNOSIS — J440 Chronic obstructive pulmonary disease with acute lower respiratory infection: Secondary | ICD-10-CM | POA: Diagnosis present

## 2017-12-02 DIAGNOSIS — I130 Hypertensive heart and chronic kidney disease with heart failure and stage 1 through stage 4 chronic kidney disease, or unspecified chronic kidney disease: Secondary | ICD-10-CM | POA: Diagnosis present

## 2017-12-02 DIAGNOSIS — Z79890 Hormone replacement therapy (postmenopausal): Secondary | ICD-10-CM

## 2017-12-02 DIAGNOSIS — K5641 Fecal impaction: Secondary | ICD-10-CM | POA: Diagnosis present

## 2017-12-02 DIAGNOSIS — E1122 Type 2 diabetes mellitus with diabetic chronic kidney disease: Secondary | ICD-10-CM | POA: Diagnosis present

## 2017-12-02 DIAGNOSIS — Z961 Presence of intraocular lens: Secondary | ICD-10-CM | POA: Diagnosis present

## 2017-12-02 DIAGNOSIS — Z87442 Personal history of urinary calculi: Secondary | ICD-10-CM

## 2017-12-02 DIAGNOSIS — J449 Chronic obstructive pulmonary disease, unspecified: Secondary | ICD-10-CM | POA: Diagnosis present

## 2017-12-02 DIAGNOSIS — I480 Paroxysmal atrial fibrillation: Secondary | ICD-10-CM | POA: Diagnosis not present

## 2017-12-02 DIAGNOSIS — E119 Type 2 diabetes mellitus without complications: Secondary | ICD-10-CM

## 2017-12-02 DIAGNOSIS — I482 Chronic atrial fibrillation: Secondary | ICD-10-CM | POA: Diagnosis present

## 2017-12-02 DIAGNOSIS — R0902 Hypoxemia: Secondary | ICD-10-CM | POA: Diagnosis present

## 2017-12-02 DIAGNOSIS — Z9841 Cataract extraction status, right eye: Secondary | ICD-10-CM

## 2017-12-02 DIAGNOSIS — R109 Unspecified abdominal pain: Secondary | ICD-10-CM

## 2017-12-02 DIAGNOSIS — D638 Anemia in other chronic diseases classified elsewhere: Secondary | ICD-10-CM | POA: Diagnosis present

## 2017-12-02 DIAGNOSIS — R739 Hyperglycemia, unspecified: Secondary | ICD-10-CM | POA: Diagnosis present

## 2017-12-02 DIAGNOSIS — N3000 Acute cystitis without hematuria: Secondary | ICD-10-CM | POA: Diagnosis present

## 2017-12-02 DIAGNOSIS — I4891 Unspecified atrial fibrillation: Secondary | ICD-10-CM | POA: Diagnosis present

## 2017-12-02 DIAGNOSIS — E785 Hyperlipidemia, unspecified: Secondary | ICD-10-CM | POA: Diagnosis present

## 2017-12-02 DIAGNOSIS — I252 Old myocardial infarction: Secondary | ICD-10-CM | POA: Diagnosis not present

## 2017-12-02 DIAGNOSIS — I5042 Chronic combined systolic (congestive) and diastolic (congestive) heart failure: Secondary | ICD-10-CM | POA: Diagnosis not present

## 2017-12-02 DIAGNOSIS — N183 Chronic kidney disease, stage 3 unspecified: Secondary | ICD-10-CM | POA: Diagnosis present

## 2017-12-02 DIAGNOSIS — Z8546 Personal history of malignant neoplasm of prostate: Secondary | ICD-10-CM

## 2017-12-02 DIAGNOSIS — J181 Lobar pneumonia, unspecified organism: Secondary | ICD-10-CM

## 2017-12-02 DIAGNOSIS — J189 Pneumonia, unspecified organism: Principal | ICD-10-CM | POA: Diagnosis present

## 2017-12-02 DIAGNOSIS — J90 Pleural effusion, not elsewhere classified: Secondary | ICD-10-CM | POA: Diagnosis present

## 2017-12-02 DIAGNOSIS — I2510 Atherosclerotic heart disease of native coronary artery without angina pectoris: Secondary | ICD-10-CM | POA: Diagnosis present

## 2017-12-02 DIAGNOSIS — R0602 Shortness of breath: Secondary | ICD-10-CM | POA: Diagnosis present

## 2017-12-02 DIAGNOSIS — I5022 Chronic systolic (congestive) heart failure: Secondary | ICD-10-CM | POA: Diagnosis present

## 2017-12-02 DIAGNOSIS — Z91048 Other nonmedicinal substance allergy status: Secondary | ICD-10-CM

## 2017-12-02 DIAGNOSIS — Z87891 Personal history of nicotine dependence: Secondary | ICD-10-CM

## 2017-12-02 DIAGNOSIS — E1165 Type 2 diabetes mellitus with hyperglycemia: Secondary | ICD-10-CM | POA: Diagnosis present

## 2017-12-02 DIAGNOSIS — Z794 Long term (current) use of insulin: Secondary | ICD-10-CM

## 2017-12-02 DIAGNOSIS — Z95810 Presence of automatic (implantable) cardiac defibrillator: Secondary | ICD-10-CM | POA: Diagnosis present

## 2017-12-02 DIAGNOSIS — Z7982 Long term (current) use of aspirin: Secondary | ICD-10-CM

## 2017-12-02 LAB — URINALYSIS, ROUTINE W REFLEX MICROSCOPIC
BILIRUBIN URINE: NEGATIVE
Hgb urine dipstick: NEGATIVE
Ketones, ur: 5 mg/dL — AB
NITRITE: NEGATIVE
PH: 6 (ref 5.0–8.0)
PROTEIN: NEGATIVE mg/dL
Specific Gravity, Urine: 1.012 (ref 1.005–1.030)

## 2017-12-02 LAB — COMPREHENSIVE METABOLIC PANEL
ALT: 22 U/L (ref 17–63)
AST: 37 U/L (ref 15–41)
Albumin: 3.1 g/dL — ABNORMAL LOW (ref 3.5–5.0)
Alkaline Phosphatase: 137 U/L — ABNORMAL HIGH (ref 38–126)
Anion gap: 13 (ref 5–15)
BILIRUBIN TOTAL: 2 mg/dL — AB (ref 0.3–1.2)
BUN: 19 mg/dL (ref 6–20)
CHLORIDE: 91 mmol/L — AB (ref 101–111)
CO2: 28 mmol/L (ref 22–32)
Calcium: 8.7 mg/dL — ABNORMAL LOW (ref 8.9–10.3)
Creatinine, Ser: 1 mg/dL (ref 0.61–1.24)
GFR calc non Af Amer: >60 mL/min (ref >60)
Glucose, Bld: 375 mg/dL — ABNORMAL HIGH (ref 65–99)
POTASSIUM: 4.3 mmol/L (ref 3.5–5.1)
Sodium: 132 mmol/L — ABNORMAL LOW (ref 135–145)
TOTAL PROTEIN: 6.8 g/dL (ref 6.5–8.1)

## 2017-12-02 LAB — I-STAT CG4 LACTIC ACID, ED: LACTIC ACID, VENOUS: 1.93 mmol/L — AB (ref 0.5–1.9)

## 2017-12-02 LAB — CBC WITH DIFFERENTIAL/PLATELET
BASOS ABS: 0 10*3/uL (ref 0.0–0.1)
BASOS PCT: 0 %
EOS PCT: 0 %
Eosinophils Absolute: 0 10*3/uL (ref 0.0–0.7)
HEMATOCRIT: 34.5 % — AB (ref 39.0–52.0)
Hemoglobin: 10.7 g/dL — ABNORMAL LOW (ref 13.0–17.0)
LYMPHS PCT: 4 %
Lymphs Abs: 0.7 10*3/uL (ref 0.7–4.0)
MCH: 26.1 pg (ref 26.0–34.0)
MCHC: 31 g/dL (ref 30.0–36.0)
MCV: 84.1 fL (ref 78.0–100.0)
Monocytes Absolute: 0.8 10*3/uL (ref 0.1–1.0)
Monocytes Relative: 5 %
NEUTROS ABS: 15.5 10*3/uL — AB (ref 1.7–7.7)
Neutrophils Relative %: 91 %
PLATELETS: 262 10*3/uL (ref 150–400)
RBC: 4.1 MIL/uL — AB (ref 4.22–5.81)
RDW: 16.4 % — AB (ref 11.5–15.5)
WBC: 17 10*3/uL — AB (ref 4.0–10.5)

## 2017-12-02 LAB — GLUCOSE, CAPILLARY
GLUCOSE-CAPILLARY: 351 mg/dL — AB (ref 65–99)
Glucose-Capillary: 362 mg/dL — ABNORMAL HIGH (ref 65–99)

## 2017-12-02 LAB — BRAIN NATRIURETIC PEPTIDE: B Natriuretic Peptide: 1175 pg/mL — ABNORMAL HIGH (ref 0.0–100.0)

## 2017-12-02 LAB — LIPASE, BLOOD: LIPASE: 18 U/L (ref 11–51)

## 2017-12-02 MED ORDER — IPRATROPIUM-ALBUTEROL 0.5-2.5 (3) MG/3ML IN SOLN
3.0000 mL | Freq: Three times a day (TID) | RESPIRATORY_TRACT | Status: DC
  Administered 2017-12-03 – 2017-12-04 (×5): 3 mL via RESPIRATORY_TRACT
  Filled 2017-12-02 (×6): qty 3

## 2017-12-02 MED ORDER — VANCOMYCIN HCL IN DEXTROSE 1-5 GM/200ML-% IV SOLN
1000.0000 mg | Freq: Once | INTRAVENOUS | Status: DC
  Filled 2017-12-02: qty 200

## 2017-12-02 MED ORDER — SENNOSIDES-DOCUSATE SODIUM 8.6-50 MG PO TABS
1.0000 | ORAL_TABLET | Freq: Every day | ORAL | Status: DC
  Administered 2017-12-02 – 2017-12-03 (×2): 1 via ORAL
  Filled 2017-12-02 (×2): qty 1

## 2017-12-02 MED ORDER — CARVEDILOL 3.125 MG PO TABS
3.1250 mg | ORAL_TABLET | Freq: Two times a day (BID) | ORAL | Status: DC
  Administered 2017-12-02 – 2017-12-04 (×5): 3.125 mg via ORAL
  Filled 2017-12-02 (×5): qty 1

## 2017-12-02 MED ORDER — AZITHROMYCIN 250 MG PO TABS
500.0000 mg | ORAL_TABLET | ORAL | Status: DC
  Administered 2017-12-02 – 2017-12-04 (×3): 500 mg via ORAL
  Filled 2017-12-02 (×3): qty 2

## 2017-12-02 MED ORDER — POLYETHYLENE GLYCOL 3350 17 G PO PACK
17.0000 g | PACK | Freq: Every day | ORAL | Status: DC
  Administered 2017-12-03: 17 g via ORAL
  Filled 2017-12-02: qty 1

## 2017-12-02 MED ORDER — INSULIN ASPART 100 UNIT/ML ~~LOC~~ SOLN
5.0000 [IU] | Freq: Three times a day (TID) | SUBCUTANEOUS | Status: DC
  Administered 2017-12-02 – 2017-12-03 (×2): 5 [IU] via SUBCUTANEOUS

## 2017-12-02 MED ORDER — SODIUM CHLORIDE 0.9 % IV SOLN
1250.0000 mg | Freq: Once | INTRAVENOUS | Status: AC
  Administered 2017-12-02: 1250 mg via INTRAVENOUS
  Filled 2017-12-02: qty 1250

## 2017-12-02 MED ORDER — SODIUM CHLORIDE 0.9 % IV BOLUS (SEPSIS)
500.0000 mL | Freq: Once | INTRAVENOUS | Status: AC
  Administered 2017-12-02: 500 mL via INTRAVENOUS

## 2017-12-02 MED ORDER — PIPERACILLIN-TAZOBACTAM 3.375 G IVPB: 3.3750 g | Freq: Three times a day (TID) | INTRAVENOUS | Status: DC

## 2017-12-02 MED ORDER — DEXTROSE 5 % IV SOLN
1.0000 g | INTRAVENOUS | Status: DC
  Administered 2017-12-02 – 2017-12-03 (×2): 1 g via INTRAVENOUS
  Filled 2017-12-02 (×4): qty 10

## 2017-12-02 MED ORDER — TAMSULOSIN HCL 0.4 MG PO CAPS
0.4000 mg | ORAL_CAPSULE | Freq: Every day | ORAL | Status: DC
  Administered 2017-12-02 – 2017-12-04 (×3): 0.4 mg via ORAL
  Filled 2017-12-02 (×3): qty 1

## 2017-12-02 MED ORDER — ENOXAPARIN SODIUM 40 MG/0.4ML ~~LOC~~ SOLN
40.0000 mg | SUBCUTANEOUS | Status: DC
  Administered 2017-12-02 – 2017-12-03 (×2): 40 mg via SUBCUTANEOUS
  Filled 2017-12-02 (×2): qty 0.4

## 2017-12-02 MED ORDER — IPRATROPIUM-ALBUTEROL 0.5-2.5 (3) MG/3ML IN SOLN
3.0000 mL | Freq: Four times a day (QID) | RESPIRATORY_TRACT | Status: DC
  Administered 2017-12-02: 3 mL via RESPIRATORY_TRACT
  Filled 2017-12-02 (×2): qty 3

## 2017-12-02 MED ORDER — INSULIN ASPART 100 UNIT/ML ~~LOC~~ SOLN: 3.0000 [IU] | Freq: Three times a day (TID) | SUBCUTANEOUS | Status: DC

## 2017-12-02 MED ORDER — INSULIN GLARGINE 100 UNIT/ML ~~LOC~~ SOLN
10.0000 [IU] | Freq: Every day | SUBCUTANEOUS | Status: DC
  Filled 2017-12-02 (×2): qty 0.1

## 2017-12-02 MED ORDER — INSULIN GLARGINE 100 UNIT/ML ~~LOC~~ SOLN
15.0000 [IU] | Freq: Every day | SUBCUTANEOUS | Status: DC
  Administered 2017-12-02: 15 [IU] via SUBCUTANEOUS
  Filled 2017-12-02 (×2): qty 0.15

## 2017-12-02 MED ORDER — LEVOTHYROXINE SODIUM 88 MCG PO TABS
88.0000 ug | ORAL_TABLET | Freq: Every day | ORAL | Status: DC
  Administered 2017-12-03 – 2017-12-04 (×2): 88 ug via ORAL
  Filled 2017-12-02 (×2): qty 1

## 2017-12-02 MED ORDER — DEXTROSE 5 % IV SOLN
1.0000 g | INTRAVENOUS | Status: DC
  Filled 2017-12-02 (×2): qty 10

## 2017-12-02 MED ORDER — SODIUM CHLORIDE 0.9 % IV SOLN
INTRAVENOUS | Status: DC
  Administered 2017-12-02 – 2017-12-03 (×2): via INTRAVENOUS

## 2017-12-02 MED ORDER — PANTOPRAZOLE SODIUM 20 MG PO TBEC
20.0000 mg | DELAYED_RELEASE_TABLET | Freq: Every day | ORAL | Status: DC
  Filled 2017-12-02 (×3): qty 1

## 2017-12-02 MED ORDER — LOSARTAN POTASSIUM 50 MG PO TABS
25.0000 mg | ORAL_TABLET | Freq: Every day | ORAL | Status: DC
  Administered 2017-12-03 – 2017-12-04 (×2): 25 mg via ORAL
  Filled 2017-12-02 (×3): qty 1

## 2017-12-02 MED ORDER — PIPERACILLIN-TAZOBACTAM 3.375 G IVPB 30 MIN
3.3750 g | Freq: Once | INTRAVENOUS | Status: AC
  Administered 2017-12-02: 3.375 g via INTRAVENOUS
  Filled 2017-12-02: qty 50

## 2017-12-02 MED ORDER — ALBUTEROL SULFATE (2.5 MG/3ML) 0.083% IN NEBU: 2.5000 mg | INHALATION_SOLUTION | RESPIRATORY_TRACT | Status: DC | PRN

## 2017-12-02 MED ORDER — INSULIN ASPART 100 UNIT/ML ~~LOC~~ SOLN
0.0000 [IU] | Freq: Three times a day (TID) | SUBCUTANEOUS | Status: DC
  Administered 2017-12-02 – 2017-12-03 (×2): 3 [IU] via SUBCUTANEOUS
  Administered 2017-12-03 (×2): 2 [IU] via SUBCUTANEOUS

## 2017-12-02 MED ORDER — MINERAL OIL RE ENEM
1.0000 | ENEMA | Freq: Once | RECTAL | Status: AC
  Administered 2017-12-02: 1 via RECTAL
  Filled 2017-12-02: qty 1

## 2017-12-02 MED ORDER — ONDANSETRON HCL 4 MG/2ML IJ SOLN
4.0000 mg | Freq: Once | INTRAMUSCULAR | Status: AC
  Administered 2017-12-02: 4 mg via INTRAVENOUS
  Filled 2017-12-02: qty 2

## 2017-12-02 NOTE — Progress Notes (Signed)
IV Rocephin not given on day shift, contacted pharmacy to have missed dose and all following doses rescheduled. Will begin Rocephin at 2100.

## 2017-12-02 NOTE — ED Notes (Signed)
Pt son called and inquired about condition and chief complaint. Pt verbalized permission for information to be discussed with pt son. Pt son verbalized understanding and requested phone number to ED. ED number provided.

## 2017-12-02 NOTE — Progress Notes (Signed)
Admitted to floor aroun 1500.  Alert and oriented. IV infusing at 50 ml hour and on 2L 02.  Requested food and ate nabs before dinner and requested urinal.  Wears pull up at home.  Mineral oil enema resulted in BM

## 2017-12-02 NOTE — Progress Notes (Signed)
Pt's CBG 362, Lantus given, no sliding scale available. MD made aware, will continue to monitor.

## 2017-12-02 NOTE — ED Provider Notes (Signed)
[facility] EMERGENCY DEPARTMENT Provider Note   CSN: [id number] Arrival date & time: 12/02/17  4193     History   Chief Complaint Chief Complaint  Patient presents with  . Flank Pain  . Emesis    HPI Rodney [name] is a 83 y.o. male.  Patient called EMS for feeling short of breath and also for right flank pain.  Patient lives by himself.  Has a history of congestive heart failure.  History of chronic kidney disease.  And COPD.  Patient states he was recently treated for urinary tract infection.  Patient is concerned the infection is gotten worse.  No chest pain.  Patient also had nausea and vomiting overnight.  The patient feels dehydrated.      Past Medical History:  Diagnosis Date  . Arthritis    "all over" (11/02/2016)  . CHF (congestive heart failure) ([place])   . Chronic kidney disease (CKD), stage III (moderate) ([place])    [name] 11/02/2016  . COPD (chronic obstructive pulmonary disease) ([place])   . Hematuria    intermittent/notes 11/02/2016  . High cholesterol   . History of kidney stones 1959  . Hypertension   . Myocardial infarction ([place])    "I had 4 at one time; ~ 2015" (11/02/2016)  . Presence of permanent cardiac pacemaker   . Prostate cancer ([place])   . Pyelonephritis 11/02/2016  . Ruptured lumbar disc    "4 of them"  . Type II diabetes mellitus [facility])     Patient Active Problem List   Diagnosis Date Noted  . Community acquired pneumonia 12/02/2017  . Hyperglycemia 12/02/2017  . Pleural effusion 12/02/2017  . CHF (congestive heart failure) ([place]) 12/02/2017  . COPD (chronic obstructive pulmonary disease) ([place]) 12/02/2017  . Anemia of chronic disease 12/02/2017  . DM (diabetes mellitus) ([place]) 11/02/2016  . Atrial fibrillation ([place]) 11/02/2016  . HTN (hypertension) 11/02/2016  . HLD (hyperlipidemia) 11/02/2016  . ICD (implantable cardioverter-defibrillator) in place 11/02/2016  . CKD (chronic kidney disease) stage 3, GFR 30-59 ml/min (HCC) 11/02/2016    . Hyponatremia 11/02/2016  . CAD (coronary artery disease) 11/02/2016  . Shortness of breath 11/02/2016    Past Surgical History:  Procedure Laterality Date  . APPENDECTOMY    . CATARACT EXTRACTION W/ INTRAOCULAR LENS  IMPLANT, BILATERAL Bilateral   . CORONARY ANGIOPLASTY WITH STENT PLACEMENT    . CORONARY ARTERY BYPASS GRAFT    . [place]  . INGUINAL HERNIA REPAIR Left   . INSERT / REPLACE / REMOVE PACEMAKER  2010  . INSERTION PROSTATE RADIATION SEED  ~ 1990  . JOINT REPLACEMENT    . TOTAL SHOULDER ARTHROPLASTY Right 2005       Home Medications    Prior to Admission medications   Medication Sig Start Date End Date Taking? Authorizing Provider  bumetanide (BUMEX) 1 MG tablet Take 2 tablets by mouth 2 (two) times daily. 08/08/17 08/08/18 Yes [provider]  insulin NPH-regular Human (HUMULIN 70/30) (70-30) 100 UNIT/ML injection Inject 24 Units into the skin every morning. 03/08/17 08/08/18 Yes [provider]  ipratropium-albuterol (DUONEB) 0.5-2.5 (3) MG/3ML SOLN Inhale into the lungs. 08/17/17 08/12/18 Yes [provider]  lidocaine-prilocaine (EMLA) cream Apply topically. 08/08/17 08/08/18 Yes [provider]  potassium chloride (K-DUR) 10 MEQ tablet Take by mouth. 08/08/17 08/08/18 Yes [provider]  acetaminophen (TYLENOL) 325 MG tablet Take 325-975 mg by mouth 2 (two) times daily as needed (for "body pains/aches").  [provider]  amiodarone (PACERONE) 200 MG tablet Take 200 mg by mouth daily.    [provider]  aspirin EC 325 MG tablet Take 162.5 mg by mouth daily.    [provider]  bisacodyl (DULCOLAX) 10 MG suppository Place 1 suppository (10 mg total) rectally daily as needed for moderate constipation. 11/09/16   Regalado, Belkys A, MD  carvedilol (COREG) 25 MG tablet Take 12.5 mg by mouth 2 (two) times daily with a meal.    [provider]  cephALEXin  (KEFLEX) 500 MG capsule Take 1 capsule (500 mg total) by mouth every 12 (twelve) hours. 11/09/16   Regalado, Belkys A, MD  glucose 4 GM chewable tablet Chew 2 tablets by mouth once as needed for low blood sugar.    [provider]  glucose 4 GM chewable tablet Chew 2 tablets by mouth as needed.    [provider]  insulin aspart (NOVOLOG) 100 UNIT/ML injection Inject into the skin.    [provider]  insulin glargine (LANTUS) 100 UNIT/ML injection Inject 0.15 mLs (15 Units total) into the skin daily. 11/10/16   Regalado, Belkys A, MD  levothyroxine (SYNTHROID, LEVOTHROID) 125 MCG tablet Take 125 mcg by mouth daily before breakfast.    [provider]  levothyroxine (SYNTHROID, LEVOTHROID) 88 MCG tablet  12/01/17   [provider]  losartan (COZAAR) 25 MG tablet Take 25 mg by mouth daily. 09/27/17   [provider]  metoprolol succinate (TOPROL-XL) 25 MG 24 hr tablet TAKE 1/2 TABLET BY MOUTH ONCE A DAY 09/21/17   [provider]  nitrofurantoin, macrocrystal-monohydrate, (MACROBID) 100 MG capsule TAKE ONE CAPSULE BY MOUTH EVERY 12 HOURS FOR 10 DAYS 10/30/17   [provider]  nystatin (MYCOSTATIN/NYSTOP) powder Apply topically 2 (two) times daily. 11/09/16   Regalado, Belkys A, MD  pantoprazole (PROTONIX) 20 MG tablet Take 1 tablet (20 mg total) by mouth daily. 11/10/16   Regalado, Belkys A, MD  polyethylene glycol (MIRALAX / GLYCOLAX) packet Take 17 g by mouth 2 (two) times daily. 11/09/16   Regalado, Belkys A, MD  polyethylene glycol powder (GLYCOLAX/MIRALAX) powder Take by mouth.    [provider]  pravastatin (PRAVACHOL) 40 MG tablet Take 20 mg by mouth every evening.    [provider]  senna-docusate (SENOKOT-S) 8.6-50 MG tablet Take 2 tablets by mouth at bedtime as needed for mild constipation. 11/09/16   Regalado, Belkys A, MD  spironolactone (ALDACTONE) 25 MG tablet Take 1 tablet (25 mg total) by mouth  daily. 11/10/16   Regalado, Belkys A, MD  tamsulosin (FLOMAX) 0.4 MG CAPS capsule Take 1 capsule (0.4 mg total) by mouth daily after supper. 11/09/16   Regalado, Belkys A, MD  torsemide (DEMADEX) 20 MG tablet Take 2 tablets (40 mg total) by mouth 2 (two) times daily. 11/09/16   Regalado, Belkys A, MD  traMADol (ULTRAM) 50 MG tablet Take 50 mg by mouth every 8 (eight) hours as needed. 10/30/17   [provider]  triamcinolone cream (KENALOG) 0.5 % Apply 1 application topically 2 (two) times daily. TO AFFECTED AREAS    [provider]    Family History History reviewed. No pertinent family history.  Social History Social History   Tobacco Use  . Smoking status: Former Smoker    Packs/day: 0.12    Years: 20.00    Pack years: 2.40    Types: Cigarettes    Last attempt to quit: 1982    Years since quitting:  36.9  . Smokeless tobacco: Former Systems developer    Types: Chew    Quit date: 1982  Substance Use Topics  . Alcohol use: Yes    Comment: 11/02/2016 "drank when I was young; quit when I was 83 yrs old"  . Drug use: No     Allergies   Other; Tape; and Sulfa antibiotics   Review of Systems Review of Systems  Constitutional: Positive for fatigue. Negative for fever.  HENT: Negative for congestion.   Eyes: Negative for redness.  Cardiovascular: Negative for chest pain.  Gastrointestinal: Positive for abdominal pain, nausea and vomiting.  Genitourinary: Positive for flank pain.  Musculoskeletal: Negative for back pain.  Skin: Negative for rash.  Neurological: Negative for headaches.  Hematological: Does not bruise/bleed easily.  Psychiatric/Behavioral: Negative for confusion.     Physical Exam Updated Vital Signs BP 113/61   Pulse 70   Temp 98.5 F (36.9 C) (Oral)   Resp (!) 21   Ht 1.702 m (5\' 7" )   Wt 68.5 kg (151 lb)   SpO2 100%   BMI 23.65 kg/m   Physical Exam  Constitutional: He is oriented to person, place, and time. He appears well-developed and  well-nourished. No distress.  HENT:  Head: Normocephalic and atraumatic.  Mucous membranes dry.  Eyes: Conjunctivae and EOM are normal. Pupils are equal, round, and reactive to light.  Neck: Neck supple.  Cardiovascular: Normal rate, regular rhythm and normal heart sounds.  Pulmonary/Chest: Effort normal and breath sounds normal. No respiratory distress.  Abdominal: Soft. Bowel sounds are normal. There is no tenderness.  Musculoskeletal: Normal range of motion. He exhibits edema.  Neurological: He is alert and oriented to person, place, and time. No cranial nerve deficit. He exhibits normal muscle tone. Coordination normal.  Skin: Skin is warm.  Nursing note and vitals reviewed.    ED Treatments / Results  Labs (all labs ordered are listed, but only abnormal results are displayed) Labs Reviewed  COMPREHENSIVE METABOLIC PANEL - Abnormal; Notable for the following components:      Result Value   Sodium 132 (*)    Chloride 91 (*)    Glucose, Bld 375 (*)    Calcium 8.7 (*)    Albumin 3.1 (*)    Alkaline Phosphatase 137 (*)    Total Bilirubin 2.0 (*)    All other components within normal limits  CBC WITH DIFFERENTIAL/PLATELET - Abnormal; Notable for the following components:   WBC 17.0 (*)    RBC 4.10 (*)    Hemoglobin 10.7 (*)    HCT 34.5 (*)    RDW 16.4 (*)    Neutro Abs 15.5 (*)    All other components within normal limits  URINALYSIS, ROUTINE W REFLEX MICROSCOPIC - Abnormal; Notable for the following components:   APPearance HAZY (*)    Glucose, UA >=500 (*)    Ketones, ur 5 (*)    Leukocytes, UA MODERATE (*)    Bacteria, UA RARE (*)    Squamous Epithelial / LPF 0-5 (*)    All other components within normal limits  BRAIN NATRIURETIC PEPTIDE - Abnormal; Notable for the following components:   B Natriuretic Peptide 1,175.0 (*)    All other components within normal limits  I-STAT CG4 LACTIC ACID, ED - Abnormal; Notable for the following components:   Lactic Acid, Venous  1.93 (*)    All other components within normal limits  CULTURE, BLOOD (ROUTINE X 2)  CULTURE, BLOOD (ROUTINE X 2)  URINE CULTURE  LIPASE, BLOOD  I-STAT CG4 LACTIC ACID, ED    EKG  EKG Interpretation  Date/Time:  Saturday December 02 2017 10:25:50 EST Ventricular Rate:  70 PR Interval:    QRS Duration: 158 QT Interval:  576 QTC Calculation: 622 R Axis:   -86 Text Interpretation:  Ventricular-paced rhythm No further analysis attempted due to paced rhythm Confirmed by Fredia Sorrow [phone]) on 12/02/2017 10:41:31 AM       Radiology Dg Chest 1 View  Result Date: 12/02/2017 CLINICAL DATA:  83 year old male with right flank pain for 3 days. Vomiting beginning this morning. Shortness of breath and hypoxia. EXAM: CHEST 1 VIEW COMPARISON:  CT Abdomen and Pelvis today reported separately. Portable chest 11/08/2016. FINDINGS: Semi upright AP view of the chest at 1037 hours. Increased streaky and patchy opacity at the lung bases, greater on the left. This was better evaluated on the CT today reported separately. Chronic cardiomegaly. Prior CABG. Left chest cardiac AICD appears stable. Pulmonary vascularity appears stable without overt edema. No pneumothorax or pneumoperitoneum. Negative visible bowel gas pattern. IMPRESSION: 1. Small pleural effusions and left greater than right lung base opacity new since 2017 and better demonstrated by CT Abdomen and Pelvis today. 2. No other acute cardiopulmonary abnormality identified. Electronically Signed   By: Genevie Ann M.D.   On: 12/02/2017 11:17   Ct Renal Stone Study  Result Date: 12/02/2017 CLINICAL DATA:  83 year old male with right flank pain for 3 days. Vomiting beginning this morning. Shortness of breath and hypoxia. EXAM: CT ABDOMEN AND PELVIS WITHOUT CONTRAST TECHNIQUE: Multidetector CT imaging of the abdomen and pelvis was performed following the standard protocol without IV contrast. COMPARISON:  CT Abdomen and Pelvis 11/09/2016. FINDINGS:  Lower chest: Cardiomegaly appears mildly progressed. No pericardial effusion. Small layering pleural effusions greater on the left. New [place] and confluent bilateral lower lobe opacity, greater on the left. Mild left lingula involvement. Hepatobiliary: Mildly distended hepatic IVC and central hepatic veins. Otherwise negative noncontrast liver. Small layering cholelithiasis or sludge but no pericholecystic inflammation. Pancreas: Atrophied. Spleen: Negative. Adrenals/Urinary Tract: Normal adrenal glands. Punctate bilateral nephrolithiasis, more apparent on the left ([place] coronal image 68). No hydronephrosis. Chronic left upper pole renal cyst. No definite acute pararenal stranding. Negative course of the left ureter. Negative course of the right ureter.  Both ureters are decompressed. Diminutive urinary bladder today. Small chronic left fundal bladder diverticulum incidentally noted (coronal image 56). No perivesical stranding. Stomach/Bowel: Mild to moderate stool ball in the rectum. Retained stool in the sigmoid colon. Moderate sigmoid diverticulosis. Continuing into the left colon. No active inflammation identified. Redundant sigmoid. Redundant transverse colon with retained stool distally. Less redundant right colon. Sequelae of appendectomy. Mild right colon diverticulosis. No active inflammation. Decompressed terminal ileum. No dilated small bowel. Stomach and duodenum within normal limits. No abdominal free air or free fluid. Vascular/Lymphatic: Vascular patency is not evaluated in the absence of IV contrast. Aortoiliac calcified atherosclerosis. New line no lymphadenopathy. Reproductive: Sequelae of prostate brachy therapy. Other: Mild presacral stranding is new.  No pelvic free fluid. Musculoskeletal: Prior sternotomy. Stable visible spine with chronic scoliosis, spondylolisthesis and occasional compression fractures. No acute osseous abnormality identified. IMPRESSION: 1. Small layering pleural  effusions and left greater than right confluent lower lobe pulmonary opacity. Consider aspiration or pneumonia. 2. Mild nonspecific presacral stranding is new since 2017. No convincing distal bowel inflammation despite sigmoid diverticulosis. Although there is a new mild to moderate stool ball in the rectum - consider fecal impaction. 3. Punctate bilateral  nephrolithiasis without acute obstructive uropathy. Diminutive urinary bladder with small chronic bladder diverticulum. 4. Dependent sludge or stones in the gallbladder but no CT evidence of acute cholecystitis. 5. Cardiomegaly appears mildly progressed since 2017. Aortic Atherosclerosis (ICD10-I70.0). Electronically Signed   By: Genevie Ann M.D.   On: 12/02/2017 11:15    Procedures Procedures (including critical care time)  CRITICAL CARE Performed by: Fredia Sorrow Total critical care time: 30 minutes Critical care time was exclusive of separately billable procedures and treating other patients. Critical care was necessary to treat or prevent imminent or life-threatening deterioration. Critical care was time spent personally by me on the following activities: development of treatment plan with patient and/or surrogate as well as nursing, discussions with consultants, evaluation of patient's response to treatment, examination of patient, obtaining history from patient or surrogate, ordering and performing treatments and interventions, ordering and review of laboratory studies, ordering and review of radiographic studies, pulse oximetry and re-evaluation of patient's condition.  Medications Ordered in ED Medications  0.9 %  sodium chloride infusion ( Intravenous New Bag/Given 12/02/17 1223)  mineral oil enema 1 enema (not administered)  piperacillin-tazobactam (ZOSYN) IVPB 3.375 g (not administered)  sodium chloride 0.9 % bolus 500 mL (0 mLs Intravenous Stopped 12/02/17 1456)  ondansetron (ZOFRAN) injection 4 mg (4 mg Intravenous Given 12/02/17  1021)  piperacillin-tazobactam (ZOSYN) IVPB 3.375 g (0 g Intravenous Stopped 12/02/17 1250)  vancomycin (VANCOCIN) 1,250 mg in sodium chloride 0.9 % 250 mL IVPB (0 mg Intravenous Stopped 12/02/17 1456)     Initial Impression / Assessment and Plan / ED Course  I have reviewed the triage vital signs and the nursing notes.  Pertinent labs & imaging results that were available during my care of the patient were reviewed by me and considered in my medical decision making (see chart for details).     Patient without fever but did have increased respiratory rate.  EMS noted the patient was hypoxic on room air.  On 2 L patient's sats are in mid 20s.  Not tachycardic.  Workup shows concerns for urinary tract infection may have pyelonephritis.  Patient did also have concerns for pleural effusions on chest x-ray and left-sided infiltrate.  Very well could have pneumonia.  This would be a community-acquired pneumonia is not been admitted recently.  White blood cell count markedly elevated.  Lactic acid borderline at 1.94.  Blood cultures done sepsis orders done with broad-spectrum antibiotics initially Zofran and vancomycin.  Feel that patient either has pyelonephritis or pneumonia.  Also has a component of dehydration some component of congestive heart failure.  Patient will require admission for further evaluation and hydration gently.   in addition patient does have hypoxia and does not have oxygen at home.  Concern patient may be getting worse.  Patient's pneumonia may get worse hypoxia may get worse patient may become septic.  Serial lactic acids following blood pressures important.  Continuing antibiotics important.  Urine culture pending patient may very well have pyelonephritis.  Hospitalist will admit.  Final Clinical Impressions(s) / ED Diagnoses   Final diagnoses:  Hypoxia  Acute cystitis without hematuria  Community acquired pneumonia of left lower lobe of lung ([place])  Pleural effusion,  bilateral    ED Discharge Orders    None       Fredia Sorrow, MD 12/02/17 [phone]

## 2017-12-02 NOTE — ED Triage Notes (Addendum)
Pt brought in by EMS. Pt reports that he been on abt for UTI and now 3 days right flank pain. Pt started vomiting this morning. Pt reports he has vomited over a dozen times.O2 sats 89 % on room air. O2 initiated 2l by EMs

## 2017-12-02 NOTE — H&P (Signed)
History and Physical  Rodney [name] IZT:[id number] DOB: [birth year] DOA: 12/02/2017  Referring physician: Rogene Houston, MD PCP: Mitzie Na, MD  Coming from: Home   Chief Complaint: Shortness of breath   HPI: Rodney [name] is a 83 y.o. male with chronic atrial fibrillation, chronic kidney disease, advanced age, CHF, coronary artery disease, hypertension and diabetes who presented to the ED via EMS reporting 3 days of progressive right flank pain.  The patient had recently been diagnosed with a urinary tract infection and was undergoing treatment for that.  He complained of shortness of breath.  He reports that he vomited over a dozen times at home.  He was noted to be hypoxic with a pulse ox of 89% on room air.  EMS started him on supplemental oxygen with good improvement.  The patient was seen by the emergency room physician and had a CT scan of his abdomen and pelvis.  It was suspicious for pneumonia and possible aspiration pneumonia.  There were some notations of a small pleural effusion.  He was also noted to have a fecal impaction.  His blood sugar was greater than 370.  He had a mildly elevated lactic acid at 1.93.  He also had an elevated BNP.  He had a leukocytosis with a white blood cell count of 17.0.  He was also noted to be anemic with a hemoglobin of 10.7.  The CT scan did show bilateral nephrolithiasis but there were no obstructive stone seen.  He was also noted to have cholelithiasis but no signs of acute cholecystitis.  He is being admitted for further evaluation and management.  Review of Systems: All systems reviewed and apart from history of presenting illness, are negative.  Past Medical History:  Diagnosis Date  . Arthritis    "all over" (11/02/2016)  . CHF (congestive heart failure) ([place])   . Chronic kidney disease (CKD), stage III (moderate) ([place])    Rodney [name] 11/02/2016  . COPD (chronic obstructive pulmonary disease) ([place])   . Hematuria    intermittent/notes 11/02/2016  . High cholesterol   . History of kidney stones 1959  . Hypertension   . Myocardial infarction ([place])    "I had 4 at one time; ~ 2015" (11/02/2016)  . Presence of permanent cardiac pacemaker   . Prostate cancer ([place])   . Pyelonephritis 11/02/2016  . Ruptured lumbar disc    "4 of them"  . Type II diabetes mellitus ([place])    Past Surgical History:  Procedure Laterality Date  . APPENDECTOMY    . CATARACT EXTRACTION W/ INTRAOCULAR LENS  IMPLANT, BILATERAL Bilateral   . CORONARY ANGIOPLASTY WITH STENT PLACEMENT    . CORONARY ARTERY BYPASS GRAFT    . [place]  . INGUINAL HERNIA REPAIR Left   . INSERT / REPLACE / REMOVE PACEMAKER  2010  . INSERTION PROSTATE RADIATION SEED  ~ 1990  . JOINT REPLACEMENT    . TOTAL SHOULDER ARTHROPLASTY Right 2005   Social History:  reports that he quit smoking about 36 years ago. His smoking use included cigarettes. He has a 2.40 pack-year smoking history. He quit smokeless tobacco use about 36 years ago. His smokeless tobacco use included chew. He reports that he drinks alcohol. He reports that he does not use drugs.  Allergies  Allergen Reactions  . Other Other (See Comments)    SKIN IS VERY THIN AND WILL TEAR & BRUISE EASILY!!  . Tape Other (See Comments)  SKIN IS VERY THIN AND WILL TEAR & BRUISE EASILY!!  . Sulfa Antibiotics Rash    History reviewed. No pertinent family history.  Prior to Admission medications   Medication Sig Start Date End Date Taking? Authorizing Provider  bumetanide (BUMEX) 1 MG tablet Take 2 tablets by mouth 2 (two) times daily. 08/08/17 08/08/18 Yes [provider]  insulin NPH-regular Human (HUMULIN 70/30) (70-30) 100 UNIT/ML injection Inject 24 Units into the skin every morning. 03/08/17 08/08/18 Yes [provider]  ipratropium-albuterol (DUONEB) 0.5-2.5 (3) MG/3ML SOLN Inhale into the lungs. 08/17/17 08/12/18 Yes [provider]    lidocaine-prilocaine (EMLA) cream Apply topically. 08/08/17 08/08/18 Yes [provider]  potassium chloride (K-DUR) 10 MEQ tablet Take by mouth. 08/08/17 08/08/18 Yes [provider]  acetaminophen (TYLENOL) 325 MG tablet Take 325-975 mg by mouth 2 (two) times daily as needed (for "body pains/aches").     [provider]  amiodarone (PACERONE) 200 MG tablet Take 200 mg by mouth daily.    [provider]  aspirin EC 325 MG tablet Take 162.5 mg by mouth daily.    [provider]  bisacodyl (DULCOLAX) 10 MG suppository Place 1 suppository (10 mg total) rectally daily as needed for moderate constipation. 11/09/16   Regalado, Belkys A, MD  carvedilol (COREG) 25 MG tablet Take 12.5 mg by mouth 2 (two) times daily with a meal.    [provider]  cephALEXin (KEFLEX) 500 MG capsule Take 1 capsule (500 mg total) by mouth every 12 (twelve) hours. 11/09/16   Regalado, Belkys A, MD  glucose 4 GM chewable tablet Chew 2 tablets by mouth once as needed for low blood sugar.    [provider]  glucose 4 GM chewable tablet Chew 2 tablets by mouth as needed.    [provider]  insulin aspart (NOVOLOG) 100 UNIT/ML injection Inject into the skin.    [provider]  insulin glargine (LANTUS) 100 UNIT/ML injection Inject 0.15 mLs (15 Units total) into the skin daily. 11/10/16   Regalado, Belkys A, MD  levothyroxine (SYNTHROID, LEVOTHROID) 125 MCG tablet Take 125 mcg by mouth daily before breakfast.    [provider]  levothyroxine (SYNTHROID, LEVOTHROID) 88 MCG tablet  12/01/17   [provider]  losartan (COZAAR) 25 MG tablet Take 25 mg by mouth daily. 09/27/17   [provider]  metoprolol succinate (TOPROL-XL) 25 MG 24 hr tablet TAKE 1/2 TABLET BY MOUTH ONCE A DAY 09/21/17   [provider]  nitrofurantoin, macrocrystal-monohydrate, (MACROBID) 100 MG capsule TAKE ONE CAPSULE BY MOUTH EVERY 12 HOURS FOR  10 DAYS 10/30/17   [provider]  nystatin (MYCOSTATIN/NYSTOP) powder Apply topically 2 (two) times daily. 11/09/16   Regalado, Belkys A, MD  pantoprazole (PROTONIX) 20 MG tablet Take 1 tablet (20 mg total) by mouth daily. 11/10/16   Regalado, Belkys A, MD  polyethylene glycol (MIRALAX / GLYCOLAX) packet Take 17 g by mouth 2 (two) times daily. 11/09/16   Regalado, Belkys A, MD  polyethylene glycol powder (GLYCOLAX/MIRALAX) powder Take by mouth.    [provider]  pravastatin (PRAVACHOL) 40 MG tablet Take 20 mg by mouth every evening.    [provider]  senna-docusate (SENOKOT-S) 8.6-50 MG tablet Take 2 tablets by mouth at bedtime as needed for mild constipation. 11/09/16   Regalado, Belkys A, MD  spironolactone (ALDACTONE) 25 MG tablet Take 1 tablet (25 mg total) by mouth daily. 11/10/16   Regalado, Cassie Freer, MD  tamsulosin (  FLOMAX) 0.4 MG CAPS capsule Take 1 capsule (0.4 mg total) by mouth daily after supper. 11/09/16   Regalado, Belkys A, MD  torsemide (DEMADEX) 20 MG tablet Take 2 tablets (40 mg total) by mouth 2 (two) times daily. 11/09/16   Regalado, Belkys A, MD  traMADol (ULTRAM) 50 MG tablet Take 50 mg by mouth every 8 (eight) hours as needed. 10/30/17   [provider]  triamcinolone cream (KENALOG) 0.5 % Apply 1 application topically 2 (two) times daily. TO AFFECTED AREAS    [provider]   Physical Exam: Vitals:   12/02/17 1212 12/02/17 1230 12/02/17 1300 12/02/17 1330  BP:  108/63 107/75 113/61  Pulse: 70 70 70 70  Resp: (!) 22 (!) 23 17 (!) 21  Temp:      TempSrc:      SpO2: 99% 97% 95% 100%  Weight:        General exam: Elderly male appears younger than stated age.  Moderately built and nourished patient, lying comfortably supine on the gurney in no obvious distress.  Head, eyes and ENT: Nontraumatic and normocephalic. Pupils equally reacting to light and accommodation. Oral mucosa dry.  Neck: Supple. No JVD, carotid bruit  or thyromegaly.  Lymphatics: No lymphadenopathy.  Respiratory system: Diminished BS at bilateral bases. No increased work of breathing.  Cardiovascular system: S1 and S2 heard.  Gastrointestinal system: Abdomen is nondistended, soft and nontender. Normal bowel sounds heard. No organomegaly or masses appreciated.  Central nervous system: Alert and oriented. No focal neurological deficits.  Extremities: Symmetric 5 x 5 power. Peripheral pulses symmetrically felt.   Skin: No rashes or acute findings.  Musculoskeletal system: Negative exam.  Psychiatry: Pleasant and cooperative.  Labs on Admission:  Basic Metabolic Panel: Recent Labs  Lab 12/02/17 1052  NA 132*  K 4.3  CL 91*  CO2 28  GLUCOSE 375*  BUN 19  CREATININE 1.00  CALCIUM 8.7*   Liver Function Tests: Recent Labs  Lab 12/02/17 1052  AST 37  ALT 22  ALKPHOS 137*  BILITOT 2.0*  PROT 6.8  ALBUMIN 3.1*   Recent Labs  Lab 12/02/17 1052  LIPASE 18   No results for input(s): AMMONIA in the last 168 hours. CBC: Recent Labs  Lab 12/02/17 1052  WBC 17.0*  NEUTROABS 15.5*  HGB 10.7*  HCT 34.5*  MCV 84.1  PLT 262   Cardiac Enzymes: No results for input(s): CKTOTAL, CKMB, CKMBINDEX, TROPONINI in the last 168 hours.  BNP (last 3 results) No results for input(s): PROBNP in the last 8760 hours. CBG: No results for input(s): GLUCAP in the last 168 hours.  Radiological Exams on Admission: Dg Chest 1 View  Result Date: 12/02/2017 CLINICAL DATA:  83 year old male with right flank pain for 3 days. Vomiting beginning this morning. Shortness of breath and hypoxia. EXAM: CHEST 1 VIEW COMPARISON:  CT Abdomen and Pelvis today reported separately. Portable chest 11/08/2016. FINDINGS: Semi upright AP view of the chest at 1037 hours. Increased streaky and patchy opacity at the lung bases, greater on the left. This was better evaluated on the CT today reported separately. Chronic cardiomegaly. Prior CABG. Left chest  cardiac AICD appears stable. Pulmonary vascularity appears stable without overt edema. No pneumothorax or pneumoperitoneum. Negative visible bowel gas pattern. IMPRESSION: 1. Small pleural effusions and left greater than right lung base opacity new since 2017 and better demonstrated by CT Abdomen and Pelvis today. 2. No other acute cardiopulmonary abnormality identified. Electronically Signed   By: Lemmie Evens  [place] Crane M.D.   On: 12/02/2017 11:17   Ct Renal Stone Study  Result Date: 12/02/2017 CLINICAL DATA:  83 year old male with right flank pain for 3 days. Vomiting beginning this morning. Shortness of breath and hypoxia. EXAM: CT ABDOMEN AND PELVIS WITHOUT CONTRAST TECHNIQUE: Multidetector CT imaging of the abdomen and pelvis was performed following the standard protocol without IV contrast. COMPARISON:  CT Abdomen and Pelvis 11/09/2016. FINDINGS: Lower chest: Cardiomegaly appears mildly progressed. No pericardial effusion. Small layering pleural effusions greater on the left. [place] and confluent bilateral lower lobe opacity, greater on the left. Mild left lingula involvement. Hepatobiliary: Mildly distended hepatic IVC and central hepatic veins. Otherwise negative noncontrast liver. Small layering cholelithiasis or sludge but no pericholecystic inflammation. Pancreas: Atrophied. Spleen: Negative. Adrenals/Urinary Tract: Normal adrenal glands. Punctate bilateral nephrolithiasis, more apparent on the left ([place] coronal image 68). No hydronephrosis. Chronic left upper pole renal cyst. No definite acute pararenal stranding. Negative course of the left ureter. Negative course of the right ureter.  Both ureters are decompressed. Diminutive urinary bladder today. Small chronic left fundal bladder diverticulum incidentally noted (coronal image 56). No perivesical stranding. Stomach/Bowel: Mild to moderate stool ball in the rectum. Retained stool in the sigmoid colon. Moderate sigmoid diverticulosis. Continuing into the  left colon. No active inflammation identified. Redundant sigmoid. Redundant transverse colon with retained stool distally. Less redundant right colon. Sequelae of appendectomy. Mild right colon diverticulosis. No active inflammation. Decompressed terminal ileum. No dilated small bowel. Stomach and duodenum within normal limits. No abdominal free air or free fluid. Vascular/Lymphatic: Vascular patency is not evaluated in the absence of IV contrast. Aortoiliac calcified atherosclerosis. New line no lymphadenopathy. Reproductive: Sequelae of prostate brachy therapy. Other: Mild presacral stranding is new.  No pelvic free fluid. Musculoskeletal: Prior sternotomy. Stable visible spine with chronic scoliosis, spondylolisthesis and occasional compression fractures. No acute osseous abnormality identified. IMPRESSION: 1. Small layering pleural effusions and left greater than right confluent lower lobe pulmonary opacity. Consider aspiration or pneumonia. 2. Mild nonspecific presacral stranding is new since 2017. No convincing distal bowel inflammation despite sigmoid diverticulosis. Although there is a new mild to moderate stool ball in the rectum - consider fecal impaction. 3. Punctate bilateral nephrolithiasis without acute obstructive uropathy. Diminutive urinary bladder with small chronic bladder diverticulum. 4. Dependent sludge or stones in the gallbladder but no CT evidence of acute cholecystitis. 5. Cardiomegaly appears mildly progressed since 2017. Aortic Atherosclerosis (ICD10-I70.0). Electronically Signed   By: Genevie Ann M.D.   On: 12/02/2017 11:15    EKG: Independently reviewed.  Ventricular paced rhythm  Assessment/Plan Principal Problem:   Community acquired pneumonia Active Problems:   DM (diabetes mellitus) ([place])   Atrial fibrillation ([place])   HTN (hypertension)   HLD (hyperlipidemia)   ICD (implantable cardioverter-defibrillator) in place   CKD (chronic kidney disease) stage 3, GFR 30-59 ml/min  ([facility])   CAD (coronary artery disease)   Shortness of breath   Hyperglycemia   Pleural effusion   CHF (congestive heart failure) ([facility])   COPD (chronic obstructive pulmonary disease) ([facility])   Anemia of chronic disease   1. Community-acquired pneumonia -admit for IV antibiotics.  Patient has been treated with IV Zosyn and Vanco in the emergency department.  We will place him on a pneumonia admission protocol with ceftriaxone and azithromycin.  Provide supportive therapy.  Oxygen as needed.  Nebulizer treatments as needed.  Incentive spirometry ordered.  Aspiration precautions.  Also will consult speech therapist for evaluation of swallowing function. 2. Dyspnea with  acute hypoxia-improved with supplemental oxygen which we will continue.  Treating pneumonia as noted above. 3. Chronic systolic congestive heart failure-does not appear to be in acute exacerbation.  Gently hydrate.  Monitor intake and I will monitor closely.  Monitor weights. 4. Right flank pain - CT renal study abdomen did show bilateral nephrolithiasis but there was no evidence of obstruction. Suspect symptoms related to UTI or possibly referred pain from pneumonia.  Follow closely.   5. CAD - stable, no symptoms, continue home medications.  6. Anemia of chronic disease-likely secondary to COPD and chronic kidney disease.  Monitor closely.  Recheck in morning. 7. Stage III CKD-gently hydrating with IV fluids and following.  Renally dose medications as appropriate. 8. Chronic atrial fibrillation-resume home medications and follow clinically. 9. Type 2 diabetes mellitus, insulin requiring with vascular complications- carbohydrate modified diet, supplemental sliding scale insulin ordered and will resume home basal insulin as appropriate. Waiting for home meds to be reconciled.  10. Hyperglycemia-order for supplemental sliding scale insulin and resuming home basal insulin with the carbohydrate modified diet. 11. Dyslipidemia-resume home  medications. 12. COPD-supplemental oxygen ordered and nebulizers ordered as needed. 13. Pleural effusion-noted to be very small, repeat chest x-ray in the morning. 14. Generalized weakness-we will consult physical therapy and occupational therapy for evaluation. 15. ICD in place-stable 16. Fecal impaction and chronic constipation-disimpaction ordered.  Enema ordered and scheduled laxatives ordered.  AT THIS TIME, HOME MEDICATIONS HAVE NOT BEEN RECONCILED AND STILL WAITING FOR PHARMACY TO COMPLETE THIS TASK.   DVT Prophylaxis: lovenox Code Status: Full   Family Communication: son  Disposition Plan: TBD   Time spent: 72 mins  Irwin Brakeman, MD [facility] Pager [phone]  If 7PM-7AM, please contact night-coverage [email or web address] Password [email or web address] 12/02/2017, 2:28 PM

## 2017-12-02 NOTE — Progress Notes (Signed)
Pharmacy Antibiotic Note  Rodney [name] is a 83 y.o. male admitted on 12/02/2017 with sepsis.  Pharmacy has been consulted for Zosyn dosing.  Plan: Zosyn 3.375g IV q8h (4 hour infusion).  F/U cxs and clinical progress Monitor V/S and labs  Weight: 151 lb (68.5 kg)  Temp (24hrs), Avg:98.5 F (36.9 C), Min:98.5 F (36.9 C), Max:98.5 F (36.9 C)  Recent Labs  Lab 12/02/17 1052 12/02/17 1220  WBC 17.0*  --   CREATININE 1.00  --   LATICACIDVEN  --  1.93*    CrCl is 66mls/min  Allergies  Allergen Reactions  . Other Other (See Comments)    SKIN IS VERY THIN AND WILL TEAR & BRUISE EASILY!!  . Tape Other (See Comments)    SKIN IS VERY THIN AND WILL TEAR & BRUISE EASILY!!  . Sulfa Antibiotics Rash    Antimicrobials this admission: Zosyn 12/15 >>  Vancomycin x 1 dose given in ED 12/15  Dose adjustments this admission: N/A  Microbiology results: 12/15 BCx: pending 12/15 UCx: pending Sputum: TBC   Thank you for allowing pharmacy to be a part of this patient's care.  Isac Sarna, BS Pharm D, [place] Clinical Pharmacist Pager [phone] 12/02/2017 2:23 PM

## 2017-12-02 NOTE — ED Notes (Signed)
Patient in CT

## 2017-12-03 ENCOUNTER — Inpatient Hospital Stay (HOSPITAL_COMMUNITY)

## 2017-12-03 DIAGNOSIS — L8990 Pressure ulcer of unspecified site, unspecified stage: Secondary | ICD-10-CM

## 2017-12-03 LAB — CBC WITH DIFFERENTIAL/PLATELET
BASOS PCT: 0 %
Basophils Absolute: 0 10*3/uL (ref 0.0–0.1)
EOS ABS: 0.4 10*3/uL (ref 0.0–0.7)
EOS PCT: 4 %
HCT: 31.3 % — ABNORMAL LOW (ref 39.0–52.0)
Hemoglobin: 9.7 g/dL — ABNORMAL LOW (ref 13.0–17.0)
Lymphocytes Relative: 14 %
Lymphs Abs: 1.2 10*3/uL (ref 0.7–4.0)
MCH: 26.6 pg (ref 26.0–34.0)
MCHC: 31 g/dL (ref 30.0–36.0)
MCV: 86 fL (ref 78.0–100.0)
MONO ABS: 0.7 10*3/uL (ref 0.1–1.0)
MONOS PCT: 8 %
NEUTROS PCT: 74 %
Neutro Abs: 6.4 10*3/uL (ref 1.7–7.7)
PLATELETS: 243 10*3/uL (ref 150–400)
RBC: 3.64 MIL/uL — ABNORMAL LOW (ref 4.22–5.81)
RDW: 16.6 % — AB (ref 11.5–15.5)
WBC: 8.7 10*3/uL (ref 4.0–10.5)

## 2017-12-03 LAB — GLUCOSE, CAPILLARY
GLUCOSE-CAPILLARY: 222 mg/dL — AB (ref 65–99)
GLUCOSE-CAPILLARY: 186 mg/dL — AB (ref 65–99)
GLUCOSE-CAPILLARY: 180 mg/dL — AB (ref 65–99)
GLUCOSE-CAPILLARY: 98 mg/dL (ref 65–99)

## 2017-12-03 LAB — COMPREHENSIVE METABOLIC PANEL
ALT: 21 U/L (ref 17–63)
ANION GAP: 8 (ref 5–15)
AST: 37 U/L (ref 15–41)
Albumin: 2.7 g/dL — ABNORMAL LOW (ref 3.5–5.0)
Alkaline Phosphatase: 118 U/L (ref 38–126)
BUN: 22 mg/dL — ABNORMAL HIGH (ref 6–20)
CHLORIDE: 92 mmol/L — AB (ref 101–111)
CO2: 31 mmol/L (ref 22–32)
Calcium: 8.6 mg/dL — ABNORMAL LOW (ref 8.9–10.3)
Creatinine, Ser: 0.98 mg/dL (ref 0.61–1.24)
GFR calc non Af Amer: >60 mL/min (ref >60)
Glucose, Bld: 213 mg/dL — ABNORMAL HIGH (ref 65–99)
Potassium: 3.9 mmol/L (ref 3.5–5.1)
SODIUM: 131 mmol/L — AB (ref 135–145)
Total Bilirubin: 1.6 mg/dL — ABNORMAL HIGH (ref 0.3–1.2)
Total Protein: 6.5 g/dL (ref 6.5–8.1)

## 2017-12-03 LAB — HEMOGLOBIN A1C
HEMOGLOBIN A1C: 9.2 % — AB (ref 4.8–5.6)
Mean Plasma Glucose: 217 mg/dL

## 2017-12-03 LAB — MAGNESIUM: MAGNESIUM: 1.7 mg/dL (ref 1.7–2.4)

## 2017-12-03 LAB — STREP PNEUMONIAE URINARY ANTIGEN: STREP PNEUMO URINARY ANTIGEN: NEGATIVE

## 2017-12-03 MED ORDER — INSULIN ASPART 100 UNIT/ML ~~LOC~~ SOLN
6.0000 [IU] | Freq: Three times a day (TID) | SUBCUTANEOUS | Status: DC
  Administered 2017-12-03 – 2017-12-04 (×3): 6 [IU] via SUBCUTANEOUS

## 2017-12-03 MED ORDER — HYDROXYZINE HCL 10 MG PO TABS: 10.0000 mg | ORAL_TABLET | Freq: Three times a day (TID) | ORAL | Status: DC | PRN

## 2017-12-03 MED ORDER — BUMETANIDE 1 MG PO TABS
2.0000 mg | ORAL_TABLET | Freq: Two times a day (BID) | ORAL | Status: DC
  Administered 2017-12-03 – 2017-12-04 (×3): 2 mg via ORAL
  Filled 2017-12-03 (×3): qty 2

## 2017-12-03 MED ORDER — TRAMADOL HCL 50 MG PO TABS
50.0000 mg | ORAL_TABLET | Freq: Two times a day (BID) | ORAL | Status: DC | PRN
  Administered 2017-12-03 – 2017-12-04 (×3): 50 mg via ORAL
  Filled 2017-12-03 (×4): qty 1

## 2017-12-03 MED ORDER — INSULIN GLARGINE 100 UNIT/ML ~~LOC~~ SOLN
20.0000 [IU] | Freq: Every day | SUBCUTANEOUS | Status: DC
  Administered 2017-12-03: 20 [IU] via SUBCUTANEOUS
  Filled 2017-12-03 (×2): qty 0.2

## 2017-12-03 MED ORDER — HYDROXYZINE HCL 10 MG PO TABS
10.0000 mg | ORAL_TABLET | Freq: Three times a day (TID) | ORAL | Status: DC | PRN
  Administered 2017-12-03: 10 mg via ORAL
  Filled 2017-12-03: qty 1

## 2017-12-03 MED ORDER — PANTOPRAZOLE SODIUM 40 MG PO TBEC
40.0000 mg | DELAYED_RELEASE_TABLET | Freq: Every day | ORAL | Status: DC
  Administered 2017-12-03 – 2017-12-04 (×2): 40 mg via ORAL
  Filled 2017-12-03 (×2): qty 1

## 2017-12-03 MED ORDER — POLYETHYLENE GLYCOL 3350 17 G PO PACK
17.0000 g | PACK | Freq: Two times a day (BID) | ORAL | Status: DC
  Administered 2017-12-03 – 2017-12-04 (×2): 17 g via ORAL
  Filled 2017-12-03 (×2): qty 1

## 2017-12-03 NOTE — Progress Notes (Signed)
PROGRESS NOTE    Rodney [name]  SEG:[id number]  DOB: 06/15/1923  DOA: 12/02/2017 PCP: Mitzie Na, MD   Brief Admission Hx: Rodney [name] is a 83 y.o. male with chronic atrial fibrillation, chronic kidney disease, advanced age, CHF, coronary artery disease, hypertension and diabetes who presented to the ED via EMS reporting 3 days of progressive right flank pain.  The patient had recently been diagnosed with a urinary tract infection and was undergoing treatment for that.  He complained of shortness of breath.  He reports that he vomited over a dozen times at home.   MDM/Assessment & Plan:   1. Community-acquired pneumonia -admitted for IV antibiotics.  He is on pneumonia admission protocol with ceftriaxone and azithromycin.  Provide supportive therapy.  Oxygen as needed.  Nebulizer treatments as needed.  Incentive spirometry ordered.  Aspiration precautions.  Also will consult speech therapist for evaluation of swallowing function. 2. Dyspnea with acute hypoxia-improved with supplemental oxygen which we will continue.  Treating pneumonia as noted above. 3. Chronic systolic congestive heart failure-does not appear to be in acute exacerbation.  Resume home bumex 2 mg BID.   Monitor intake and I will monitor closely.  Monitor weights. 4. Right flank pain - CT renal study abdomen did show bilateral nephrolithiasis but there was no evidence of obstruction. Suspect symptoms related to UTI or possibly referred pain from pneumonia.  Follow closely.   5. CAD - stable, no symptoms, continue home medications.  6. Anemia of chronic disease-likely secondary to COPD and chronic kidney disease.  Monitor closely.  Recheck in morning. 7. Stage III CKD-gently hydrated with IV fluids.  Renal function improved. 8. Chronic atrial fibrillation-resume home medications and follow clinically. 9. Type 2 diabetes mellitus, insulin requiring with vascular complications- carbohydrate modified diet,  supplemental sliding scale insulin ordered and will resume home basal insulin as appropriate. Waiting for home meds to be reconciled.  10. Hyperglycemia-order for supplemental sliding scale insulin and resuming home basal insulin with the carbohydrate modified diet. 11. Dyslipidemia-resume home medications. 12. COPD-supplemental oxygen ordered and nebulizers ordered as needed. 13. Pleural effusion-noted to be very small, repeat chest x-ray in the morning. 14. Generalized weakness-we will consult physical therapy and occupational therapy for evaluation. 15. ICD in place-stable 16. Fecal impaction and chronic constipation-disimpaction ordered.  Enema ordered and scheduled laxatives ordered.  AT THIS TIME, HOME MEDICATIONS HAVE NOT BEEN RECONCILED AND STILL WAITING FOR PHARMACY TO COMPLETE THIS TASK.   DVT Prophylaxis: lovenox Code Status: Full   Family Communication: son  Disposition Plan: TBD   Subjective: Pt reports that he still feels right sided flank pain.   Objective: Vitals:   12/02/17 2156 12/03/17 0600 12/03/17 0752 12/03/17 0802  BP: 107/66 105/63  111/63  Pulse: 70 71    Resp: (!) 21 20    Temp: 98.2 F (36.8 C) 98.1 F (36.7 C)    TempSrc: Oral Oral    SpO2: 100% 98% 91%   Weight:      Height:        Intake/Output Summary (Last 24 hours) at 12/03/2017 1001 Last data filed at 12/03/2017 0300 Gross per 24 hour  Intake 1050 ml  Output 200 ml  Net 850 ml   Filed Weights   12/02/17 0727 12/02/17 1400  Weight: 68.5 kg (151 lb) 68.5 kg (151 lb)   CBG (last 3)  Recent Labs    12/02/17 1628 12/02/17 2158 12/03/17 0724  GLUCAP 351* 362* 222*   REVIEW OF SYSTEMS  As per history otherwise all reviewed and reported negative  Exam:  General exam: elderly male, awake, alert, NAD. Respiratory system: diminished BS bilateral bases. No increased work of breathing. Cardiovascular system: S1 & S2 heard. No JVD, murmurs, gallops, clicks or pedal  edema. Gastrointestinal system: Abdomen is nondistended, soft and nontender. Normal bowel sounds heard. Central nervous system: Alert and oriented. No focal neurological deficits. Extremities: no CCE.  Data Reviewed: Basic Metabolic Panel: Recent Labs  Lab 12/02/17 1052 12/03/17 0524  NA 132* 131*  K 4.3 3.9  CL 91* 92*  CO2 28 31  GLUCOSE 375* 213*  BUN 19 22*  CREATININE 1.00 0.98  CALCIUM 8.7* 8.6*  MG  --  1.7   Liver Function Tests: Recent Labs  Lab 12/02/17 1052 12/03/17 0524  AST 37 37  ALT 22 21  ALKPHOS 137* 118  BILITOT 2.0* 1.6*  PROT 6.8 6.5  ALBUMIN 3.1* 2.7*   Recent Labs  Lab 12/02/17 1052  LIPASE 18   No results for input(s): AMMONIA in the last 168 hours. CBC: Recent Labs  Lab 12/02/17 1052 12/03/17 0524  WBC 17.0* 8.7  NEUTROABS 15.5* 6.4  HGB 10.7* 9.7*  HCT 34.5* 31.3*  MCV 84.1 86.0  PLT 262 243   Cardiac Enzymes: No results for input(s): CKTOTAL, CKMB, CKMBINDEX, TROPONINI in the last 168 hours. CBG (last 3)  Recent Labs    12/02/17 1628 12/02/17 2158 12/03/17 0724  GLUCAP 351* 362* 222*   Recent Results (from the past 240 hour(s))  Blood Culture (routine x 2)     Status: None (Preliminary result)   Collection Time: 12/02/17 12:33 PM  Result Value Ref Range Status   Specimen Description   Final    BLOOD RIGHT FOREARM BOTTLES DRAWN AEROBIC AND ANAEROBIC   Special Requests Blood Culture adequate volume  Final   Culture NO GROWTH < 24 HOURS  Final   Report Status PENDING  Incomplete  Blood Culture (routine x 2)     Status: None (Preliminary result)   Collection Time: 12/02/17 12:40 PM  Result Value Ref Range Status   Specimen Description   Final    LEFT ANTECUBITAL BOTTLES DRAWN AEROBIC AND ANAEROBIC   Special Requests   Final    Blood Culture results may not be optimal due to an inadequate volume of blood received in culture bottles   Culture NO GROWTH < 24 HOURS  Final   Report Status PENDING  Incomplete      Studies: Dg Chest 1 View  Result Date: 12/02/2017 CLINICAL DATA:  83 year old male with right flank pain for 3 days. Vomiting beginning this morning. Shortness of breath and hypoxia. EXAM: CHEST 1 VIEW COMPARISON:  CT Abdomen and Pelvis today reported separately. Portable chest 11/08/2016. FINDINGS: Semi upright AP view of the chest at 1037 hours. Increased streaky and patchy opacity at the lung bases, greater on the left. This was better evaluated on the CT today reported separately. Chronic cardiomegaly. Prior CABG. Left chest cardiac AICD appears stable. Pulmonary vascularity appears stable without overt edema. No pneumothorax or pneumoperitoneum. Negative visible bowel gas pattern. IMPRESSION: 1. Small pleural effusions and left greater than right lung base opacity new since 2017 and better demonstrated by CT Abdomen and Pelvis today. 2. No other acute cardiopulmonary abnormality identified. Electronically Signed   By: Genevie Ann M.D.   On: 12/02/2017 11:17   Dg Chest Port 1 View  Result Date: 12/03/2017 CLINICAL DATA:  83 year old male with recent flank pain vomiting  and shortness of breath. EXAM: PORTABLE CHEST 1 VIEW COMPARISON:  12/02/2017 and earlier. FINDINGS: Portable AP upright view at 0605 hours. Larger lung volumes but increased dense retrocardiac and left lung base opacity now obscuring the left hemidiaphragm. Stable cardiomegaly and mediastinal contours. Stable left chest AICD. Prior CABG. No pneumothorax. Increasing bilateral interstitial opacity. Small bilateral pleural effusions better demonstrated by CT yesterday. IMPRESSION: 1. Larger lung volumes but worsening left lung base opacity now obscuring the left hemidiaphragm, suspicious for progressed pneumonia. 2. Increasing bilateral pulmonary interstitial opacity. Consider progressed viral/atypical respiratory infection versus pulmonary interstitial edema. Electronically Signed   By: Genevie Ann M.D.   On: 12/03/2017 08:07   Ct Renal Stone  Study  Result Date: 12/02/2017 CLINICAL DATA:  83 year old male with right flank pain for 3 days. Vomiting beginning this morning. Shortness of breath and hypoxia. EXAM: CT ABDOMEN AND PELVIS WITHOUT CONTRAST TECHNIQUE: Multidetector CT imaging of the abdomen and pelvis was performed following the standard protocol without IV contrast. COMPARISON:  CT Abdomen and Pelvis 11/09/2016. FINDINGS: Lower chest: Cardiomegaly appears mildly progressed. No pericardial effusion. Small layering pleural effusions greater on the left. [place] and confluent bilateral lower lobe opacity, greater on the left. Mild left lingula involvement. Hepatobiliary: Mildly distended hepatic IVC and central hepatic veins. Otherwise negative noncontrast liver. Small layering cholelithiasis or sludge but no pericholecystic inflammation. Pancreas: Atrophied. Spleen: Negative. Adrenals/Urinary Tract: Normal adrenal glands. Punctate bilateral nephrolithiasis, more apparent on the left ([place] coronal image 68). No hydronephrosis. Chronic left upper pole renal cyst. No definite acute pararenal stranding. Negative course of the left ureter. Negative course of the right ureter.  Both ureters are decompressed. Diminutive urinary bladder today. Small chronic left fundal bladder diverticulum incidentally noted (coronal image 56). No perivesical stranding. Stomach/Bowel: Mild to moderate stool ball in the rectum. Retained stool in the sigmoid colon. Moderate sigmoid diverticulosis. Continuing into the left colon. No active inflammation identified. Redundant sigmoid. Redundant transverse colon with retained stool distally. Less redundant right colon. Sequelae of appendectomy. Mild right colon diverticulosis. No active inflammation. Decompressed terminal ileum. No dilated small bowel. Stomach and duodenum within normal limits. No abdominal free air or free fluid. Vascular/Lymphatic: Vascular patency is not evaluated in the absence of IV contrast.  Aortoiliac calcified atherosclerosis. New line no lymphadenopathy. Reproductive: Sequelae of prostate brachy therapy. Other: Mild presacral stranding is new.  No pelvic free fluid. Musculoskeletal: Prior sternotomy. Stable visible spine with chronic scoliosis, spondylolisthesis and occasional compression fractures. No acute osseous abnormality identified. IMPRESSION: 1. Small layering pleural effusions and left greater than right confluent lower lobe pulmonary opacity. Consider aspiration or pneumonia. 2. Mild nonspecific presacral stranding is new since 2017. No convincing distal bowel inflammation despite sigmoid diverticulosis. Although there is a new mild to moderate stool ball in the rectum - consider fecal impaction. 3. Punctate bilateral nephrolithiasis without acute obstructive uropathy. Diminutive urinary bladder with small chronic bladder diverticulum. 4. Dependent sludge or stones in the gallbladder but no CT evidence of acute cholecystitis. 5. Cardiomegaly appears mildly progressed since 2017. Aortic Atherosclerosis (ICD10-I70.0). Electronically Signed   By: Genevie Ann M.D.   On: 12/02/2017 11:15   Scheduled Meds: . azithromycin  500 mg Oral Q24H  . bumetanide  2 mg Oral BID  . carvedilol  3.125 mg Oral BID WC  . enoxaparin (LOVENOX) injection  40 mg Subcutaneous Q24H  . insulin aspart  0-9 Units Subcutaneous TID WC  . insulin aspart  6 Units Subcutaneous TID WC  . insulin glargine  20 Units Subcutaneous QHS  . ipratropium-albuterol  3 mL Nebulization TID  . levothyroxine  88 mcg Oral QAC breakfast  . losartan  25 mg Oral Daily  . pantoprazole  40 mg Oral Daily  . polyethylene glycol  17 g Oral Daily  . senna-docusate  1 tablet Oral QHS  . tamsulosin  0.4 mg Oral QPC supper   Continuous Infusions: . cefTRIAXone (ROCEPHIN)  IV Stopped (12/02/17 2247)    Principal Problem:   Community acquired pneumonia Active Problems:   DM (diabetes mellitus) ([place])   Atrial fibrillation (HCC)    HTN (hypertension)   HLD (hyperlipidemia)   ICD (implantable cardioverter-defibrillator) in place   CKD (chronic kidney disease) stage 3, GFR 30-59 ml/min (HCC)   CAD (coronary artery disease)   Shortness of breath   Hyperglycemia   Pleural effusion   CHF (congestive heart failure) (HCC)   COPD (chronic obstructive pulmonary disease) (HCC)   Anemia of chronic disease   Pressure injury of skin   Time spent:   Irwin Brakeman, MD, FAAFP [facility] Pager [phone]  If 7PM-7AM, please contact night-coverage [email or web address] Password [email or web address] 12/03/2017, 10:01 AM    LOS: 1 day

## 2017-12-04 DIAGNOSIS — I10 Essential (primary) hypertension: Secondary | ICD-10-CM

## 2017-12-04 LAB — COMPREHENSIVE METABOLIC PANEL
ALK PHOS: 116 U/L (ref 38–126)
ALT: 20 U/L (ref 17–63)
ANION GAP: 8 (ref 5–15)
AST: 33 U/L (ref 15–41)
Albumin: 2.7 g/dL — ABNORMAL LOW (ref 3.5–5.0)
BUN: 20 mg/dL (ref 6–20)
CALCIUM: 8.8 mg/dL — AB (ref 8.9–10.3)
CO2: 32 mmol/L (ref 22–32)
CREATININE: 0.91 mg/dL (ref 0.61–1.24)
Chloride: 96 mmol/L — ABNORMAL LOW (ref 101–111)
Glucose, Bld: 97 mg/dL (ref 65–99)
Potassium: 3.5 mmol/L (ref 3.5–5.1)
SODIUM: 136 mmol/L (ref 135–145)
Total Bilirubin: 0.9 mg/dL (ref 0.3–1.2)
Total Protein: 6.6 g/dL (ref 6.5–8.1)

## 2017-12-04 LAB — CBC WITH DIFFERENTIAL/PLATELET
BASOS PCT: 0 %
Basophils Absolute: 0 10*3/uL (ref 0.0–0.1)
EOS ABS: 0.4 10*3/uL (ref 0.0–0.7)
Eosinophils Relative: 5 %
HEMATOCRIT: 33.8 % — AB (ref 39.0–52.0)
HEMOGLOBIN: 10.2 g/dL — AB (ref 13.0–17.0)
LYMPHS ABS: 1.2 10*3/uL (ref 0.7–4.0)
Lymphocytes Relative: 15 %
MCH: 26 pg (ref 26.0–34.0)
MCHC: 30.2 g/dL (ref 30.0–36.0)
MCV: 86 fL (ref 78.0–100.0)
MONO ABS: 0.6 10*3/uL (ref 0.1–1.0)
MONOS PCT: 8 %
NEUTROS PCT: 72 %
Neutro Abs: 5.7 10*3/uL (ref 1.7–7.7)
Platelets: 265 10*3/uL (ref 150–400)
RBC: 3.93 MIL/uL — ABNORMAL LOW (ref 4.22–5.81)
RDW: 16.8 % — AB (ref 11.5–15.5)
WBC: 8 10*3/uL (ref 4.0–10.5)

## 2017-12-04 LAB — GLUCOSE, CAPILLARY
Glucose-Capillary: 90 mg/dL (ref 65–99)
Glucose-Capillary: 112 mg/dL — ABNORMAL HIGH (ref 65–99)
Glucose-Capillary: 50 mg/dL — ABNORMAL LOW (ref 65–99)
Glucose-Capillary: 189 mg/dL — ABNORMAL HIGH (ref 65–99)

## 2017-12-04 LAB — MAGNESIUM: MAGNESIUM: 1.6 mg/dL — AB (ref 1.7–2.4)

## 2017-12-04 LAB — URINE CULTURE

## 2017-12-04 MED ORDER — INSULIN ASPART 100 UNIT/ML ~~LOC~~ SOLN
5.0000 [IU] | Freq: Three times a day (TID) | SUBCUTANEOUS | Status: DC
  Administered 2017-12-04: 5 [IU] via SUBCUTANEOUS

## 2017-12-04 MED ORDER — INSULIN ASPART 100 UNIT/ML ~~LOC~~ SOLN: 5.0000 [IU] | Freq: Three times a day (TID) | SUBCUTANEOUS | 11 refills | Status: DC

## 2017-12-04 MED ORDER — DOXYCYCLINE HYCLATE 100 MG PO CAPS: 100.0000 mg | ORAL_CAPSULE | Freq: Two times a day (BID) | ORAL | 0 refills | Status: AC

## 2017-12-04 MED ORDER — TRAMADOL HCL 50 MG PO TABS: 50.0000 mg | ORAL_TABLET | Freq: Three times a day (TID) | ORAL | 0 refills | Status: AC | PRN

## 2017-12-04 MED ORDER — IPRATROPIUM-ALBUTEROL 0.5-2.5 (3) MG/3ML IN SOLN: 3.0000 mL | Freq: Four times a day (QID) | RESPIRATORY_TRACT | Status: DC | PRN

## 2017-12-04 MED ORDER — INSULIN GLARGINE 100 UNIT/ML ~~LOC~~ SOLN
18.0000 [IU] | Freq: Every day | SUBCUTANEOUS | Status: DC
  Filled 2017-12-04: qty 0.18

## 2017-12-04 MED ORDER — INSULIN GLARGINE 100 UNIT/ML ~~LOC~~ SOLN: 15.0000 [IU] | Freq: Every day | SUBCUTANEOUS | 11 refills | Status: DC

## 2017-12-04 NOTE — Discharge Summary (Signed)
Physician Discharge Summary  Rodney [name] TOI:[id number] DOB: September 20, 1923 DOA: 12/02/2017  PCP: Mitzie Na, MD  Admit date: 12/02/2017 Discharge date: 12/04/2017  Admitted From: Home  Disposition: Home   PT REFUSED SN[facility]  Recommendations for Outpatient Follow-up:  1. Follow up with PCP in 1 weeks 2. Please obtain BMP/CBC in one week 3. Please follow up on the following pending results: FINAL CULTURE DATA  Discharge Condition: STABLE   CODE STATUS: FULL    Brief Hospitalization Summary: Please see all hospital notes, images, labs for full details of the hospitalization. HPI: Rodney [name] is a 83 y.o. male with chronic atrial fibrillation, chronic kidney disease, advanced age, CHF, coronary artery disease, hypertension and diabetes who presented to the ED via EMS reporting 3 days of progressive right flank pain.  The patient had recently been diagnosed with a urinary tract infection and was undergoing treatment for that.  He complained of shortness of breath.  He reports that he vomited over a dozen times at home.  He was noted to be hypoxic with a pulse ox of 89% on room air.  EMS started him on supplemental oxygen with good improvement.  The patient was seen by the emergency room physician and had a CT scan of his abdomen and pelvis.  It was suspicious for pneumonia and possible aspiration pneumonia.  There were some notations of a small pleural effusion.  He was also noted to have a fecal impaction.  His blood sugar was greater than 370.  He had a mildly elevated lactic acid at 1.93.  He also had an elevated BNP.  He had a leukocytosis with a white blood cell count of 17.0.  He was also noted to be anemic with a hemoglobin of 10.7.  The CT scan did show bilateral nephrolithiasis but there were no obstructive stone seen.  He was also noted to have cholelithiasis but no signs of acute cholecystitis.  He is being admitted for further  evaluation and management.  Brief Admission Hx: Rodney [name] a 83 y.o.malewith chronic atrial fibrillation, chronic kidney disease, advanced age, CHF, coronary artery disease, hypertension and diabetes who presented to the ED via EMS reporting 3 days of progressive right flank pain. The patient had recently been diagnosed with a urinary tract infection and was undergoing treatment for that. He complained of shortness of breath. He reports that he vomited over a dozen times at home.   MDM/Assessment & Plan:   1. Community-acquired pneumonia-admitted for IV antibiotics. He is on pneumonia admission protocol with ceftriaxone and azithromycin. Provide supportive therapy. He is improved.  Oxygen weaned to room air. Nebulizer treatments as needed in the hospital. Incentive spirometry ordered. Aspiration precautions.  2. Dyspnea with acute hypoxia-improved with supplemental oxygen which we will continue. Treating pneumonia as noted above.  Weaned off oxygen to room air.  3. Chronic systolic congestive heart failure-does not appear to be in acute exacerbation. Resumed home bumex 2 mg BID.  Monitor intake and I will monitor closely. Monitor weights. 4. Right flank pain - resolved now.  CT renal study abdomen did show bilateral nephrolithiasis but there was no evidence of obstruction. Suspect symptoms related to UTI or possibly referred pain from pneumonia. Follow closely.  5. CAD - stable, no symptoms, continue home medications. 6. Anemia of chronic disease-likely secondary to COPD and chronic kidney disease. Monitored closely. 7. Stage III CKD-gently hydrated with IV fluids. Renal function improved. 8. Chronic atrial fibrillation-resume home medications  and follow clinically. 9. Type 2 diabetes mellitus, insulin requiring with vascular complications- carbohydrate modified diet, supplemental sliding scale insulin ordered and will resume home basal insulin as  appropriate. 10. Hyperglycemia-order for supplemental sliding scale insulin and resuming home insulin with the carbohydrate modified diet and heart healthy diet at discharge. 11. Dyslipidemia-resume home medications. 12. COPD-supplemental oxygen ordered and nebulizers ordered as needed. 13. Pleural effusion-stable, continue home diuretics. 14. Generalized weakness-we will consult physical therapy and occupational therapy for evaluation. 15. ICD in place-stable 16. Fecal impaction and chronic constipation-disimpaction completed and having BMs.  17. Pressure injury of skin - clinically undetermined stage present on admission. Continue wound care protocol.  DVT Prophylaxis:lovenox Code Status:Full Family Communication:son Disposition Plan:SNF REFUSED BY PATIENT, WILL DISCHARGE HOME, RESUME HOME HOSPICE SERVICES PER CARE MANAGER  Discharge Diagnoses:  Principal Problem:   Community acquired pneumonia Active Problems:   DM (diabetes mellitus) ([place])   Atrial fibrillation ([place])   HTN (hypertension)   HLD (hyperlipidemia)   ICD (implantable cardioverter-defibrillator) in place   CKD (chronic kidney disease) stage 3, GFR 30-59 ml/min (H[facility])   CAD (coronary artery disease)   Shortness of breath   Hyperglycemia   Pleural effusion   CHF (congestive heart failure) (H[facility])   COPD (chronic obstructive pulmonary disease) (H[facility])   Anemia of chronic disease   Pressure injury of skin    Discharge Instructions: Discharge Instructions    (HEART FAILURE PATIENTS) Call MD:  Anytime you have any of the following symptoms: 1) 3 pound weight gain in 24 hours or 5 pounds in 1 week 2) shortness of breath, with or without a dry hacking cough 3) swelling in the hands, feet or stomach 4) if you have to sleep on extra pillows at night in order to breathe.   Complete by:  As directed    (HEART FAILURE PATIENTS) Call MD:  Anytime you have any of the following symptoms: 1) 3 pound weight gain in 24 hours  or 5 pounds in 1 week 2) shortness of breath, with or without a dry hacking cough 3) swelling in the hands, feet or stomach 4) if you have to sleep on extra pillows at night in order to breathe.   Complete by:  As directed    Call MD for:  difficulty breathing, headache or visual disturbances   Complete by:  As directed    Call MD for:  difficulty breathing, headache or visual disturbances   Complete by:  As directed    Call MD for:  extreme fatigue   Complete by:  As directed    Call MD for:  extreme fatigue   Complete by:  As directed    Call MD for:  persistant dizziness or light-headedness   Complete by:  As directed    Call MD for:  persistant dizziness or light-headedness   Complete by:  As directed    Call MD for:  persistant nausea and vomiting   Complete by:  As directed    Call MD for:  persistant nausea and vomiting   Complete by:  As directed    Call MD for:  severe uncontrolled pain   Complete by:  As directed    Call MD for:  severe uncontrolled pain   Complete by:  As directed    Diet - low sodium heart healthy   Complete by:  As directed    Diet - low sodium heart healthy   Complete by:  As directed    Increase activity slowly  Complete by:  As directed    Increase activity slowly   Complete by:  As directed      Allergies as of 12/04/2017      Reactions   Other Other (See Comments)   SKIN IS VERY THIN AND WILL TEAR & BRUISE EASILY!!   Tape Other (See Comments)   SKIN IS VERY THIN AND WILL TEAR & BRUISE EASILY!!   Sulfa Antibiotics Rash      Medication List    STOP taking these medications   NOVOLOG 100 UNIT/ML injection Generic drug:  insulin aspart     TAKE these medications   acetaminophen 325 MG tablet Commonly known as:  TYLENOL Take 325-975 mg by mouth 2 (two) times daily as needed (for "body pains/aches").   amiodarone 200 MG tablet Commonly known as:  PACERONE Take 200 mg by mouth daily.   bumetanide 1 MG tablet Commonly known as:   BUMEX Take 2 tablets by mouth 2 (two) times daily.   doxycycline 100 MG capsule Commonly known as:  VIBRAMYCIN Take 1 capsule (100 mg total) by mouth 2 (two) times daily for 5 days.   glucose 4 GM chewable tablet Chew 2 tablets by mouth once as needed for low blood sugar.   HUMULIN 70/30 (70-30) 100 UNIT/ML injection Generic drug:  insulin NPH-regular Human Inject 24 Units into the skin 2 (two) times daily as needed. Patient takes twice a day on an as needed basis; if patient's blood sugar is in the range of 200, he takes 12 units; if patient's blood glucose is greater than 300, patient takes 25 units   levothyroxine 88 MCG tablet Commonly known as:  SYNTHROID, LEVOTHROID Take 88 mcg by mouth daily before breakfast.   metoprolol succinate 25 MG 24 hr tablet Commonly known as:  TOPROL-XL TAKE 1 TABLET BY MOUTH ONCE A DAY   pantoprazole 20 MG tablet Commonly known as:  PROTONIX Take 1 tablet (20 mg total) by mouth daily.   polyethylene glycol packet Commonly known as:  MIRALAX / GLYCOLAX Take 17 g by mouth 2 (two) times daily. What changed:  Another medication with the same name was removed. Continue taking this medication, and follow the directions you see here.   senna-docusate 8.6-50 MG tablet Commonly known as:  Senokot-S Take 2 tablets by mouth at bedtime as needed for mild constipation.   tamsulosin 0.4 MG Caps capsule Commonly known as:  FLOMAX Take 1 capsule (0.4 mg total) by mouth daily after supper.   traMADol 50 MG tablet Commonly known as:  ULTRAM Take 1 tablet (50 mg total) by mouth every 8 (eight) hours as needed for moderate pain. What changed:  reasons to take this   triamcinolone cream 0.5 % Commonly known as:  KENALOG Apply 1 application topically 2 (two) times daily. TO AFFECTED AREAS      Follow-up Information    Mitzie Na, MD. Schedule an appointment as soon as possible for a visit in 1 week(s).   Specialty:  Cardiology Why:   Hospital Follow Up Contact information: [facility] [address] [place] [address] [phone]          Allergies  Allergen Reactions  . Other Other (See Comments)    SKIN IS VERY THIN AND WILL TEAR & BRUISE EASILY!!  . Tape Other (See Comments)    SKIN IS VERY THIN AND WILL TEAR & BRUISE EASILY!!  . Sulfa Antibiotics Rash   Allergies as of 12/04/2017  Reactions   Other Other (See Comments)   SKIN IS VERY THIN AND WILL TEAR & BRUISE EASILY!!   Tape Other (See Comments)   SKIN IS VERY THIN AND WILL TEAR & BRUISE EASILY!!   Sulfa Antibiotics Rash      Medication List    STOP taking these medications   NOVOLOG 100 UNIT/ML injection Generic drug:  insulin aspart     TAKE these medications   acetaminophen 325 MG tablet Commonly known as:  TYLENOL Take 325-975 mg by mouth 2 (two) times daily as needed (for "body pains/aches").   amiodarone 200 MG tablet Commonly known as:  PACERONE Take 200 mg by mouth daily.   bumetanide 1 MG tablet Commonly known as:  BUMEX Take 2 tablets by mouth 2 (two) times daily.   doxycycline 100 MG capsule Commonly known as:  VIBRAMYCIN Take 1 capsule (100 mg total) by mouth 2 (two) times daily for 5 days.   glucose 4 GM chewable tablet Chew 2 tablets by mouth once as needed for low blood sugar.   HUMULIN 70/30 (70-30) 100 UNIT/ML injection Generic drug:  insulin NPH-regular Human Inject 24 Units into the skin 2 (two) times daily as needed. Patient takes twice a day on an as needed basis; if patient's blood sugar is in the range of 200, he takes 12 units; if patient's blood glucose is greater than 300, patient takes 25 units   levothyroxine 88 MCG tablet Commonly known as:  SYNTHROID, LEVOTHROID Take 88 mcg by mouth daily before breakfast.   metoprolol succinate 25 MG 24 hr tablet Commonly known as:  TOPROL-XL TAKE 1 TABLET BY MOUTH ONCE A DAY   pantoprazole 20 MG tablet Commonly known as:  PROTONIX Take  1 tablet (20 mg total) by mouth daily.   polyethylene glycol packet Commonly known as:  MIRALAX / GLYCOLAX Take 17 g by mouth 2 (two) times daily. What changed:  Another medication with the same name was removed. Continue taking this medication, and follow the directions you see here.   senna-docusate 8.6-50 MG tablet Commonly known as:  Senokot-S Take 2 tablets by mouth at bedtime as needed for mild constipation.   tamsulosin 0.4 MG Caps capsule Commonly known as:  FLOMAX Take 1 capsule (0.4 mg total) by mouth daily after supper.   traMADol 50 MG tablet Commonly known as:  ULTRAM Take 1 tablet (50 mg total) by mouth every 8 (eight) hours as needed for moderate pain. What changed:  reasons to take this   triamcinolone cream 0.5 % Commonly known as:  KENALOG Apply 1 application topically 2 (two) times daily. TO AFFECTED AREAS       Procedures/Studies: Dg Chest 1 View  Result Date: 12/02/2017 CLINICAL DATA:  83 year old male with right flank pain for 3 days. Vomiting beginning this morning. Shortness of breath and hypoxia. EXAM: CHEST 1 VIEW COMPARISON:  CT Abdomen and Pelvis today reported separately. Portable chest 11/08/2016. FINDINGS: Semi upright AP view of the chest at 1037 hours. Increased streaky and patchy opacity at the lung bases, greater on the left. This was better evaluated on the CT today reported separately. Chronic cardiomegaly. Prior CABG. Left chest cardiac AICD appears stable. Pulmonary vascularity appears stable without overt edema. No pneumothorax or pneumoperitoneum. Negative visible bowel gas pattern. IMPRESSION: 1. Small pleural effusions and left greater than right lung base opacity new since 2017 and better demonstrated by CT Abdomen and Pelvis today. 2. No other acute cardiopulmonary abnormality identified. Electronically Signed  By: Genevie Ann M.D.   On: 12/02/2017 11:17   Dg Chest Port 1 View  Result Date: 12/03/2017 CLINICAL DATA:  83 year old male with  recent flank pain vomiting and shortness of breath. EXAM: PORTABLE CHEST 1 VIEW COMPARISON:  12/02/2017 and earlier. FINDINGS: Portable AP upright view at 0605 hours. Larger lung volumes but increased dense retrocardiac and left lung base opacity now obscuring the left hemidiaphragm. Stable cardiomegaly and mediastinal contours. Stable left chest AICD. Prior CABG. No pneumothorax. Increasing bilateral interstitial opacity. Small bilateral pleural effusions better demonstrated by CT yesterday. IMPRESSION: 1. Larger lung volumes but worsening left lung base opacity now obscuring the left hemidiaphragm, suspicious for progressed pneumonia. 2. Increasing bilateral pulmonary interstitial opacity. Consider progressed viral/atypical respiratory infection versus pulmonary interstitial edema. Electronically Signed   By: Genevie Ann M.D.   On: 12/03/2017 08:07   Ct Renal Stone Study  Result Date: 12/02/2017 CLINICAL DATA:  83 year old male with right flank pain for 3 days. Vomiting beginning this morning. Shortness of breath and hypoxia. EXAM: CT ABDOMEN AND PELVIS WITHOUT CONTRAST TECHNIQUE: Multidetector CT imaging of the abdomen and pelvis was performed following the standard protocol without IV contrast. COMPARISON:  CT Abdomen and Pelvis 11/09/2016. FINDINGS: Lower chest: Cardiomegaly appears mildly progressed. No pericardial effusion. Small layering pleural effusions greater on the left. [place] and confluent bilateral lower lobe opacity, greater on the left. Mild left lingula involvement. Hepatobiliary: Mildly distended hepatic IVC and central hepatic veins. Otherwise negative noncontrast liver. Small layering cholelithiasis or sludge but no pericholecystic inflammation. Pancreas: Atrophied. Spleen: Negative. Adrenals/Urinary Tract: Normal adrenal glands. Punctate bilateral nephrolithiasis, more apparent on the left ([place] coronal image 68). No hydronephrosis. Chronic left upper pole renal cyst. No definite acute  pararenal stranding. Negative course of the left ureter. Negative course of the right ureter.  Both ureters are decompressed. Diminutive urinary bladder today. Small chronic left fundal bladder diverticulum incidentally noted (coronal image 56). No perivesical stranding. Stomach/Bowel: Mild to moderate stool ball in the rectum. Retained stool in the sigmoid colon. Moderate sigmoid diverticulosis. Continuing into the left colon. No active inflammation identified. Redundant sigmoid. Redundant transverse colon with retained stool distally. Less redundant right colon. Sequelae of appendectomy. Mild right colon diverticulosis. No active inflammation. Decompressed terminal ileum. No dilated small bowel. Stomach and duodenum within normal limits. No abdominal free air or free fluid. Vascular/Lymphatic: Vascular patency is not evaluated in the absence of IV contrast. Aortoiliac calcified atherosclerosis. New line no lymphadenopathy. Reproductive: Sequelae of prostate brachy therapy. Other: Mild presacral stranding is new.  No pelvic free fluid. Musculoskeletal: Prior sternotomy. Stable visible spine with chronic scoliosis, spondylolisthesis and occasional compression fractures. No acute osseous abnormality identified. IMPRESSION: 1. Small layering pleural effusions and left greater than right confluent lower lobe pulmonary opacity. Consider aspiration or pneumonia. 2. Mild nonspecific presacral stranding is new since 2017. No convincing distal bowel inflammation despite sigmoid diverticulosis. Although there is a new mild to moderate stool ball in the rectum - consider fecal impaction. 3. Punctate bilateral nephrolithiasis without acute obstructive uropathy. Diminutive urinary bladder with small chronic bladder diverticulum. 4. Dependent sludge or stones in the gallbladder but no CT evidence of acute cholecystitis. 5. Cardiomegaly appears mildly progressed since 2017. Aortic Atherosclerosis (ICD10-I70.0). Electronically  Signed   By: Genevie Ann M.D.   On: 12/02/2017 11:15      Subjective:   Discharge Exam: Vitals:   12/04/17 1344 12/04/17 1453  BP: 109/68   Pulse: 73   Resp: 19  Temp: 98 F (36.7 C)   SpO2: 99% 95%   Vitals:   12/04/17 0446 12/04/17 0838 12/04/17 1344 12/04/17 1453  BP: 114/63  109/68   Pulse: 70  73   Resp: 18  19   Temp: 98.2 F (36.8 C)  98 F (36.7 C)   TempSrc: Oral  Oral   SpO2: 100% 97% 99% 95%  Weight: 67.3 kg (148 lb 5.9 oz)     Height:        General: Pt is alert, awake, not in acute distress Cardiovascular: RRR, S1/S2 +, no rubs, no gallops Respiratory: CTA bilaterally, no wheezing, no rhonchi Abdominal: Soft, NT, ND, bowel sounds + Extremities: no edema, no cyanosis   The results of significant diagnostics from this hospitalization (including imaging, microbiology, ancillary and laboratory) are listed below for reference.     Microbiology: Recent Results (from the past 240 hour(s))  Urine culture     Status: Abnormal   Collection Time: 12/02/17 10:00 AM  Result Value Ref Range Status   Specimen Description URINE, CLEAN CATCH  Final   Special Requests NONE  Final   Culture MULTIPLE SPECIES PRESENT, SUGGEST RECOLLECTION (A)  Final   Report Status 12/04/2017 FINAL  Final  Blood Culture (routine x 2)     Status: None (Preliminary result)   Collection Time: 12/02/17 12:33 PM  Result Value Ref Range Status   Specimen Description   Final    BLOOD RIGHT FOREARM BOTTLES DRAWN AEROBIC AND ANAEROBIC   Special Requests Blood Culture adequate volume  Final   Culture NO GROWTH 2 DAYS  Final   Report Status PENDING  Incomplete  Blood Culture (routine x 2)     Status: None (Preliminary result)   Collection Time: 12/02/17 12:40 PM  Result Value Ref Range Status   Specimen Description   Final    LEFT ANTECUBITAL BOTTLES DRAWN AEROBIC AND ANAEROBIC   Special Requests   Final    Blood Culture results may not be optimal due to an inadequate volume of blood received  in culture bottles   Culture NO GROWTH 2 DAYS  Final   Report Status PENDING  Incomplete     Labs: BNP (last 3 results) Recent Labs    12/02/17 1052  BNP 6,283.6*   Basic Metabolic Panel: Recent Labs  Lab 12/02/17 1052 12/03/17 0524 12/04/17 0604  NA 132* 131* 136  K 4.3 3.9 3.5  CL 91* 92* 96*  CO2 28 31 32  GLUCOSE 375* 213* 97  BUN 19 22* 20  CREATININE 1.00 0.98 0.91  CALCIUM 8.7* 8.6* 8.8*  MG  --  1.7 1.6*   Liver Function Tests: Recent Labs  Lab 12/02/17 1052 12/03/17 0524 12/04/17 0604  AST 37 37 33  ALT 22 21 20   ALKPHOS 137* 118 116  BILITOT 2.0* 1.6* 0.9  PROT 6.8 6.5 6.6  ALBUMIN 3.1* 2.7* 2.7*   Recent Labs  Lab 12/02/17 1052  LIPASE 18   No results for input(s): AMMONIA in the last 168 hours. CBC: Recent Labs  Lab 12/02/17 1052 12/03/17 0524 12/04/17 0604  WBC 17.0* 8.7 8.0  NEUTROABS 15.5* 6.4 5.7  HGB 10.7* 9.7* 10.2*  HCT 34.5* 31.3* 33.8*  MCV 84.1 86.0 86.0  PLT 262 243 265   Cardiac Enzymes: No results for input(s): CKTOTAL, CKMB, CKMBINDEX, TROPONINI in the last 168 hours. BNP: Invalid input(s): POCBNP CBG: Recent Labs  Lab 12/03/17 1115 12/03/17 1749 12/03/17 2232 12/04/17 0733 12/04/17 1201  GLUCAP 186*  180* 98 90 112*   D-Dimer No results for input(s): DDIMER in the last 72 hours. Hgb A1c Recent Labs    12/02/17 1052  HGBA1C 9.2*   Lipid Profile No results for input(s): CHOL, HDL, LDLCALC, TRIG, CHOLHDL, LDLDIRECT in the last 72 hours. Thyroid function studies No results for input(s): TSH, T4TOTAL, T3FREE, THYROIDAB in the last 72 hours.  Invalid input(s): FREET3 Anemia work up No results for input(s): VITAMINB12, FOLATE, FERRITIN, TIBC, IRON, RETICCTPCT in the last 72 hours. Urinalysis    Component Value Date/Time   COLORURINE YELLOW 12/02/2017 1010   APPEARANCEUR HAZY (A) 12/02/2017 1010   LABSPEC 1.012 12/02/2017 1010   PHURINE 6.0 12/02/2017 1010   GLUCOSEU >=500 (A) 12/02/2017 1010   HGBUR  NEGATIVE 12/02/2017 1010   BILIRUBINUR NEGATIVE 12/02/2017 1010   KETONESUR 5 (A) 12/02/2017 1010   PROTEINUR NEGATIVE 12/02/2017 1010   NITRITE NEGATIVE 12/02/2017 1010   LEUKOCYTESUR MODERATE (A) 12/02/2017 1010   Sepsis Labs Invalid input(s): PROCALCITONIN,  WBC,  LACTICIDVEN Microbiology Recent Results (from the past 240 hour(s))  Urine culture     Status: Abnormal   Collection Time: 12/02/17 10:00 AM  Result Value Ref Range Status   Specimen Description URINE, CLEAN CATCH  Final   Special Requests NONE  Final   Culture MULTIPLE SPECIES PRESENT, SUGGEST RECOLLECTION (A)  Final   Report Status 12/04/2017 FINAL  Final  Blood Culture (routine x 2)     Status: None (Preliminary result)   Collection Time: 12/02/17 12:33 PM  Result Value Ref Range Status   Specimen Description   Final    BLOOD RIGHT FOREARM BOTTLES DRAWN AEROBIC AND ANAEROBIC   Special Requests Blood Culture adequate volume  Final   Culture NO GROWTH 2 DAYS  Final   Report Status PENDING  Incomplete  Blood Culture (routine x 2)     Status: None (Preliminary result)   Collection Time: 12/02/17 12:40 PM  Result Value Ref Range Status   Specimen Description   Final    LEFT ANTECUBITAL BOTTLES DRAWN AEROBIC AND ANAEROBIC   Special Requests   Final    Blood Culture results may not be optimal due to an inadequate volume of blood received in culture bottles   Culture NO GROWTH 2 DAYS  Final   Report Status PENDING  Incomplete    Time coordinating discharge: 37 mins  SIGNED:  Irwin Brakeman, MD  [facility] 12/04/2017, 4:10 PM Pager [phone]  If 7PM-7AM, please contact night-coverage [email or web address] Password [email or web address]

## 2017-12-04 NOTE — Plan of Care (Signed)
Pt is progressing 

## 2017-12-04 NOTE — Care Management (Signed)
CM received call from Doreatha Lew [phone]) from [facility]. Pt is active, they recommend placement). CM discussed PT's recommendations, has no been worked up yet but CM/SW would follow up and let them know DC plan tomorrow. Per kara, pt's POA is son Macarthur Lorusso. Information given to [employer], CSW.

## 2017-12-04 NOTE — Plan of Care (Signed)
  Acute Rehab PT Goals(only PT should resolve) Pt Will Go Supine/Side To Sit 12/04/2017 1010 - Progressing by Lonell Grandchild, PT Flowsheets Taken 12/04/2017 1010  Pt will go Supine/Side to Sit with min guard assist Pt Will Go Sit To Supine/Side 12/04/2017 1010 - Progressing by Lonell Grandchild, PT Flowsheets Taken 12/04/2017 1010  Pt will go Sit to Supine/Side with min guard assist Patient Will Transfer Sit To/From Stand 12/04/2017 1010 - Progressing by Lonell Grandchild, PT Flowsheets Taken 12/04/2017 1010  Patient will transfer sit to/from stand with minimal assist Pt Will Transfer Bed To Chair/Chair To Bed 12/04/2017 1010 - Progressing by Lonell Grandchild, PT Flowsheets Taken 12/04/2017 1010  Pt will Transfer Bed to Chair/Chair to Bed with min assist Pt Will Ambulate 12/04/2017 1010 - Progressing by Lonell Grandchild, PT Flowsheets Taken 12/04/2017 1010  Pt will Ambulate 25 feet;with rolling walker;with moderate assist  10:11 AM, 12/04/17 Lonell Grandchild, MPT Physical Therapist with [facility] [phone] office [phone] mobile phone

## 2017-12-04 NOTE — Care Management (Signed)
Cm notified by Ambrose Pancoast, CSW that pt is refusing SNF and wants to return home. CM has made [place] with [facility] aware. Pt says he needs no DME and will have a ride home if discharged this afternoon. CM made aware pt refusing SNF.

## 2017-12-04 NOTE — Clinical Social Work Note (Signed)
LCSW met with patient. Patient declined SNF at this time. He stated that he has been to SNF a few times and that he comes out worse than when he went in. Patient states that he has 24 hour care and that he also has people who take him places. He declined SNF multiple times. Patient's son, Marcello Moores, was agreeable for patient to go to home with HHPT. He stated that they have found that patient does better with one week of HHPT versus three weeks of SNF.    LCSW signing off.

## 2017-12-04 NOTE — Progress Notes (Addendum)
PROGRESS NOTE    Rodney [name]  KZS:[id number]  DOB: 27-May-1923  DOA: 12/02/2017 PCP: Mitzie Na, MD   Brief Admission Hx: Rodney [name] is a 83 y.o. male with chronic atrial fibrillation, chronic kidney disease, advanced age, CHF, coronary artery disease, hypertension and diabetes who presented to the ED via EMS reporting 3 days of progressive right flank pain.  The patient had recently been diagnosed with a urinary tract infection and was undergoing treatment for that.  He complained of shortness of breath.  He reports that he vomited over a dozen times at home.   MDM/Assessment & Plan:   1. Community-acquired pneumonia -admitted for IV antibiotics.  He is on pneumonia admission protocol with ceftriaxone and azithromycin.  Provide supportive therapy.  He is slowly improving.  Oxygen as needed.  Nebulizer treatments as needed.  Incentive spirometry ordered.  Aspiration precautions.  Also will consult speech therapist for evaluation of swallowing function. 2. Dyspnea with acute hypoxia-improved with supplemental oxygen which we will continue.  Treating pneumonia as noted above. 3. Chronic systolic congestive heart failure-does not appear to be in acute exacerbation.  Resumed home bumex 2 mg BID.   Monitor intake and I will monitor closely.  Monitor weights. 4. Right flank pain - resolved now.  CT renal study abdomen did show bilateral nephrolithiasis but there was no evidence of obstruction. Suspect symptoms related to UTI or possibly referred pain from pneumonia.  Follow closely.   5. CAD - stable, no symptoms, continue home medications.  6. Anemia of chronic disease-likely secondary to COPD and chronic kidney disease.  Monitored closely. 7. Stage III CKD-gently hydrated with IV fluids.  Renal function improved. 8. Chronic atrial fibrillation-resume home medications and follow clinically. 9. Type 2 diabetes mellitus, insulin requiring with vascular complications-  carbohydrate modified diet, supplemental sliding scale insulin ordered and will resume home basal insulin as appropriate.   10. Hyperglycemia-order for supplemental sliding scale insulin and resuming home basal insulin with the carbohydrate modified diet. 11. Dyslipidemia-resume home medications. 12. COPD-supplemental oxygen ordered and nebulizers ordered as needed. 13. Pleural effusion-stable, continue home diuretics. 14. Generalized weakness-we will consult physical therapy and occupational therapy for evaluation. 15. ICD in place-stable 16. Fecal impaction and chronic constipation-disimpaction completed and having BMs.  17. Pressure injury of skin - clinically undetermined stage present on admission. Continue wound care protocol.   DVT Prophylaxis: lovenox Code Status: Full   Family Communication: son  Disposition Plan: SNF tomorrow  Subjective: Pt says flank pain is better.  He was sitting up in chair today.    Objective: Vitals:   12/03/17 2051 12/03/17 2237 12/04/17 0446 12/04/17 0838  BP:  102/63 114/63   Pulse:  70 70   Resp:  18 18   Temp:  98.3 F (36.8 C) 98.2 F (36.8 C)   TempSrc:  Oral Oral   SpO2: 95% 100% 100% 97%  Weight:   67.3 kg (148 lb 5.9 oz)   Height:        Intake/Output Summary (Last 24 hours) at 12/04/2017 1100 Last data filed at 12/04/2017 0450 Gross per 24 hour  Intake 290 ml  Output 1600 ml  Net -1310 ml   Filed Weights   12/02/17 0727 12/02/17 1400 12/04/17 0446  Weight: 68.5 kg (151 lb) 68.5 kg (151 lb) 67.3 kg (148 lb 5.9 oz)   CBG (last 3)  Recent Labs    12/03/17 1749 12/03/17 2232 12/04/17 0733  GLUCAP 180* 98 90   REVIEW  OF SYSTEMS  As per history otherwise all reviewed and reported negative  Exam:  General exam: elderly male, awake, alert, NAD. Respiratory system: diminished BS bilateral bases. No increased work of breathing. Cardiovascular system: S1 & S2 heard. No JVD, murmurs, gallops, clicks or pedal  edema. Gastrointestinal system: Abdomen is nondistended, soft and nontender. Normal bowel sounds heard. Central nervous system: Alert and oriented. No focal neurological deficits. Extremities: no CCE.  Data Reviewed: Basic Metabolic Panel: Recent Labs  Lab 12/02/17 1052 12/03/17 0524 12/04/17 0604  NA 132* 131* 136  K 4.3 3.9 3.5  CL 91* 92* 96*  CO2 28 31 32  GLUCOSE 375* 213* 97  BUN 19 22* 20  CREATININE 1.00 0.98 0.91  CALCIUM 8.7* 8.6* 8.8*  MG  --  1.7 1.6*   Liver Function Tests: Recent Labs  Lab 12/02/17 1052 12/03/17 0524 12/04/17 0604  AST 37 37 33  ALT 22 21 20   ALKPHOS 137* 118 116  BILITOT 2.0* 1.6* 0.9  PROT 6.8 6.5 6.6  ALBUMIN 3.1* 2.7* 2.7*   Recent Labs  Lab 12/02/17 1052  LIPASE 18   No results for input(s): AMMONIA in the last 168 hours. CBC: Recent Labs  Lab 12/02/17 1052 12/03/17 0524 12/04/17 0604  WBC 17.0* 8.7 8.0  NEUTROABS 15.5* 6.4 5.7  HGB 10.7* 9.7* 10.2*  HCT 34.5* 31.3* 33.8*  MCV 84.1 86.0 86.0  PLT 262 243 265   Cardiac Enzymes: No results for input(s): CKTOTAL, CKMB, CKMBINDEX, TROPONINI in the last 168 hours. CBG (last 3)  Recent Labs    12/03/17 1749 12/03/17 2232 12/04/17 0733  GLUCAP 180* 98 90   Recent Results (from the past 240 hour(s))  Urine culture     Status: Abnormal   Collection Time: 12/02/17 10:00 AM  Result Value Ref Range Status   Specimen Description URINE, CLEAN CATCH  Final   Special Requests NONE  Final   Culture MULTIPLE SPECIES PRESENT, SUGGEST RECOLLECTION (A)  Final   Report Status 12/04/2017 FINAL  Final  Blood Culture (routine x 2)     Status: None (Preliminary result)   Collection Time: 12/02/17 12:33 PM  Result Value Ref Range Status   Specimen Description   Final    BLOOD RIGHT FOREARM BOTTLES DRAWN AEROBIC AND ANAEROBIC   Special Requests Blood Culture adequate volume  Final   Culture NO GROWTH 2 DAYS  Final   Report Status PENDING  Incomplete  Blood Culture (routine x 2)      Status: None (Preliminary result)   Collection Time: 12/02/17 12:40 PM  Result Value Ref Range Status   Specimen Description   Final    LEFT ANTECUBITAL BOTTLES DRAWN AEROBIC AND ANAEROBIC   Special Requests   Final    Blood Culture results may not be optimal due to an inadequate volume of blood received in culture bottles   Culture NO GROWTH 2 DAYS  Final   Report Status PENDING  Incomplete     Studies: Dg Chest 1 View  Result Date: 12/02/2017 CLINICAL DATA:  83 year old male with right flank pain for 3 days. Vomiting beginning this morning. Shortness of breath and hypoxia. EXAM: CHEST 1 VIEW COMPARISON:  CT Abdomen and Pelvis today reported separately. Portable chest 11/08/2016. FINDINGS: Semi upright AP view of the chest at 1037 hours. Increased streaky and patchy opacity at the lung bases, greater on the left. This was better evaluated on the CT today reported separately. Chronic cardiomegaly. Prior CABG. Left chest cardiac AICD  appears stable. Pulmonary vascularity appears stable without overt edema. No pneumothorax or pneumoperitoneum. Negative visible bowel gas pattern. IMPRESSION: 1. Small pleural effusions and left greater than right lung base opacity new since 2017 and better demonstrated by CT Abdomen and Pelvis today. 2. No other acute cardiopulmonary abnormality identified. Electronically Signed   By: Genevie Ann M.D.   On: 12/02/2017 11:17   Dg Chest Port 1 View  Result Date: 12/03/2017 CLINICAL DATA:  83 year old male with recent flank pain vomiting and shortness of breath. EXAM: PORTABLE CHEST 1 VIEW COMPARISON:  12/02/2017 and earlier. FINDINGS: Portable AP upright view at 0605 hours. Larger lung volumes but increased dense retrocardiac and left lung base opacity now obscuring the left hemidiaphragm. Stable cardiomegaly and mediastinal contours. Stable left chest AICD. Prior CABG. No pneumothorax. Increasing bilateral interstitial opacity. Small bilateral pleural effusions better  demonstrated by CT yesterday. IMPRESSION: 1. Larger lung volumes but worsening left lung base opacity now obscuring the left hemidiaphragm, suspicious for progressed pneumonia. 2. Increasing bilateral pulmonary interstitial opacity. Consider progressed viral/atypical respiratory infection versus pulmonary interstitial edema. Electronically Signed   By: Genevie Ann M.D.   On: 12/03/2017 08:07   Ct Renal Stone Study  Result Date: 12/02/2017 CLINICAL DATA:  83 year old male with right flank pain for 3 days. Vomiting beginning this morning. Shortness of breath and hypoxia. EXAM: CT ABDOMEN AND PELVIS WITHOUT CONTRAST TECHNIQUE: Multidetector CT imaging of the abdomen and pelvis was performed following the standard protocol without IV contrast. COMPARISON:  CT Abdomen and Pelvis 11/09/2016. FINDINGS: Lower chest: Cardiomegaly appears mildly progressed. No pericardial effusion. Small layering pleural effusions greater on the left. New [place] and confluent bilateral lower lobe opacity, greater on the left. Mild left lingula involvement. Hepatobiliary: Mildly distended hepatic IVC and central hepatic veins. Otherwise negative noncontrast liver. Small layering cholelithiasis or sludge but no pericholecystic inflammation. Pancreas: Atrophied. Spleen: Negative. Adrenals/Urinary Tract: Normal adrenal glands. Punctate bilateral nephrolithiasis, more apparent on the left ([place] coronal image 68). No hydronephrosis. Chronic left upper pole renal cyst. No definite acute pararenal stranding. Negative course of the left ureter. Negative course of the right ureter.  Both ureters are decompressed. Diminutive urinary bladder today. Small chronic left fundal bladder diverticulum incidentally noted (coronal image 56). No perivesical stranding. Stomach/Bowel: Mild to moderate stool ball in the rectum. Retained stool in the sigmoid colon. Moderate sigmoid diverticulosis. Continuing into the left colon. No active inflammation identified.  Redundant sigmoid. Redundant transverse colon with retained stool distally. Less redundant right colon. Sequelae of appendectomy. Mild right colon diverticulosis. No active inflammation. Decompressed terminal ileum. No dilated small bowel. Stomach and duodenum within normal limits. No abdominal free air or free fluid. Vascular/Lymphatic: Vascular patency is not evaluated in the absence of IV contrast. Aortoiliac calcified atherosclerosis. New line no lymphadenopathy. Reproductive: Sequelae of prostate brachy therapy. Other: Mild presacral stranding is new.  No pelvic free fluid. Musculoskeletal: Prior sternotomy. Stable visible spine with chronic scoliosis, spondylolisthesis and occasional compression fractures. No acute osseous abnormality identified. IMPRESSION: 1. Small layering pleural effusions and left greater than right confluent lower lobe pulmonary opacity. Consider aspiration or pneumonia. 2. Mild nonspecific presacral stranding is new since 2017. No convincing distal bowel inflammation despite sigmoid diverticulosis. Although there is a new mild to moderate stool ball in the rectum - consider fecal impaction. 3. Punctate bilateral nephrolithiasis without acute obstructive uropathy. Diminutive urinary bladder with small chronic bladder diverticulum. 4. Dependent sludge or stones in the gallbladder but no CT evidence of acute cholecystitis. 5.  Cardiomegaly appears mildly progressed since 2017. Aortic Atherosclerosis (ICD10-I70.0). Electronically Signed   By: Genevie Ann M.D.   On: 12/02/2017 11:15   Scheduled Meds: . azithromycin  500 mg Oral Q24H  . bumetanide  2 mg Oral BID  . carvedilol  3.125 mg Oral BID WC  . enoxaparin (LOVENOX) injection  40 mg Subcutaneous Q24H  . insulin aspart  0-9 Units Subcutaneous TID WC  . insulin aspart  6 Units Subcutaneous TID WC  . insulin glargine  20 Units Subcutaneous QHS  . ipratropium-albuterol  3 mL Nebulization TID  . levothyroxine  88 mcg Oral QAC breakfast    . losartan  25 mg Oral Daily  . pantoprazole  40 mg Oral Daily  . polyethylene glycol  17 g Oral BID  . senna-docusate  1 tablet Oral QHS  . tamsulosin  0.4 mg Oral QPC supper   Continuous Infusions: . cefTRIAXone (ROCEPHIN)  IV Stopped (12/03/17 2218)    Principal Problem:   Community acquired pneumonia Active Problems:   DM (diabetes mellitus) ([place])   Atrial fibrillation (HCC)   HTN (hypertension)   HLD (hyperlipidemia)   ICD (implantable cardioverter-defibrillator) in place   CKD (chronic kidney disease) stage 3, GFR 30-59 ml/min (HCC)   CAD (coronary artery disease)   Shortness of breath   Hyperglycemia   Pleural effusion   CHF (congestive heart failure) (HCC)   COPD (chronic obstructive pulmonary disease) (HCC)   Anemia of chronic disease   Pressure injury of skin  Time spent:   Irwin Brakeman, MD, FAAFP [facility] Pager [phone]  If 7PM-7AM, please contact night-coverage [email or web address] Password [email or web address] 12/04/2017, 11:00 AM    LOS: 2 days

## 2017-12-04 NOTE — ED Notes (Signed)
Correction to date on note from 12/04/17 at 1219. Date for blood cultures drawn should be 12/02/2017 at 1219.

## 2017-12-04 NOTE — ED Notes (Signed)
1st set of Blood cultures drawn with IV in right forearm by this Probation officer.

## 2017-12-04 NOTE — Evaluation (Signed)
Physical Therapy Evaluation Patient Details Name: Rodney [name] MRN: [id number] DOB: March 06, 1923 Today's Date: 12/04/2017   History of Present Illness  Rodney [name] is a 83 y.o. male with chronic atrial fibrillation, chronic kidney disease, advanced age, CHF, coronary artery disease, hypertension and diabetes who presented to the ED via EMS reporting 3 days of progressive right flank pain.  The patient had recently been diagnosed with a urinary tract infection and was undergoing treatment for that.  He complained of shortness of breath.  He reports that he vomited over a dozen times at home.  He was noted to be hypoxic with a pulse ox of 89% on room air.  EMS started him on supplemental oxygen with good improvement.  The patient was seen by the emergency room physician and had a CT scan of his abdomen and pelvis.  It was suspicious for pneumonia and possible aspiration pneumonia.  There were some notations of a small pleural effusion.  He was also noted to have a fecal impaction.  His blood sugar was greater than 370.  He had a mildly elevated lactic acid at 1.93.  He also had an elevated BNP.  He had a leukocytosis with a white blood cell count of 17.0.  He was also noted to be anemic with a hemoglobin of 10.7.  The CT scan did show bilateral nephrolithiasis but there were no obstructive stone seen.  He was also noted to have cholelithiasis but no signs of acute cholecystitis.  He is being admitted for further evaluation and management.    Clinical Impression  Patient has difficulty for supine to sit and sit to stands requiring VC's for proper hand placement, near loss of balance during transfer to chair and limited to a few steps due to poor balance/BLE weakness.  Patient will benefit from continued physical therapy in hospital and recommended venue below to increase strength, balance, endurance for safe ADLs and gait.    Follow Up Recommendations SNF    Equipment Recommendations  None  recommended by PT    Recommendations for Other Services       Precautions / Restrictions Precautions Precautions: Fall Restrictions Weight Bearing Restrictions: No      Mobility  Bed Mobility Overal bed mobility: Needs Assistance Bed Mobility: Supine to Sit     Supine to sit: Mod assist        Transfers Overall transfer level: Needs assistance Equipment used: Rolling walker (2 wheeled) Transfers: Sit to/from [facility] Sit to Stand: Mod assist;Max assist Stand pivot transfers: Max assist          Ambulation/Gait Ambulation/Gait assistance: Max assist Ambulation Distance (Feet): 2 Feet Assistive device: Rolling walker (2 wheeled) Gait Pattern/deviations: Decreased step length - right;Decreased step length - left;Decreased stance time - left;Decreased stride length     General Gait Details: Patient limited to 3-4 steps due to poor balance, BLE weakness and increased left heel pain  Stairs            Wheelchair Mobility    Modified Rankin (Stroke Patients Only)       Balance Overall balance assessment: Needs assistance Sitting-balance support: No upper extremity supported;Feet supported Sitting balance-Leahy Scale: Good     Standing balance support: Bilateral upper extremity supported;During functional activity Standing balance-Leahy Scale: Poor                               Pertinent Vitals/Pain Pain Assessment: 0-10  Pain Score: 10-Worst pain ever Pain Location: left heel sore Pain Descriptors / Indicators: Aching;Discomfort Pain Intervention(s): Limited activity within patient's tolerance;Monitored during session    [place] expects to be discharged to:: Private residence Living Arrangements: Alone Available Help at Discharge: Family;Available PRN/intermittently(has 2 sons that live nearby per patient) Type of Home: House Home Access: Ramped entrance     Home Layout: Two level Home  Equipment: [place] - 2 wheels;Cane - single point;Shower seat;Bedside commode;Wheelchair - manual;Hospital bed      Prior Function Level of Independence: Independent with assistive device(s)               Hand Dominance        Extremity/Trunk Assessment   Upper Extremity Assessment Upper Extremity Assessment: Generalized weakness    Lower Extremity Assessment Lower Extremity Assessment: Generalized weakness    Cervical / Trunk Assessment Cervical / Trunk Assessment: Kyphotic  Communication   Communication: No difficulties  Cognition Arousal/Alertness: Awake/alert Behavior During Therapy: WFL for tasks assessed/performed Overall Cognitive Status: Within Functional Limits for tasks assessed                                        General Comments      Exercises     Assessment/Plan    PT Assessment Patient needs continued PT services  PT Problem List Decreased strength;Decreased activity tolerance;Decreased balance;Decreased mobility;Pain       PT Treatment Interventions Gait training;Functional mobility training;Therapeutic activities;Therapeutic exercise;Patient/family education    PT Goals (Current goals can be found in the Care Plan section)       Frequency Min 3X/week   Barriers to discharge        Co-evaluation               AM-PAC PT "6 Clicks" Daily Activity  Outcome Measure Difficulty turning over in bed (including adjusting bedclothes, sheets and blankets)?: A Lot Difficulty moving from lying on back to sitting on the side of the bed? : A Lot Difficulty sitting down on and standing up from a chair with arms (e.g., wheelchair, bedside commode, etc,.)?: A Lot Help needed moving to and from a bed to chair (including a wheelchair)?: A Lot Help needed walking in hospital room?: Total Help needed climbing 3-5 steps with a railing? : Total 6 Click Score: 10    End of Session Equipment Utilized During Treatment: Gait  belt Activity Tolerance: Patient limited by fatigue;Patient limited by pain Patient left: in chair;with call bell/phone within reach Nurse Communication: Mobility status PT Visit Diagnosis: Unsteadiness on feet (R26.81);Other abnormalities of gait and mobility (R26.89);Muscle weakness (generalized) (M62.81)    Time: 6761-9509 PT Time Calculation (min) (ACUTE ONLY): 24 min   Charges:   PT Evaluation $PT Eval Moderate Complexity: 1 Mod PT Treatments $Therapeutic Activity: 23-37 mins   PT G Codes:   PT G-Codes **NOT FOR INPATIENT CLASS** Functional Assessment Tool Used: AM-PAC 6 Clicks Basic Mobility Functional Limitation: Mobility: Walking and moving around Mobility: Walking and Moving Around Current Status (T2671): At least 60 percent but less than 80 percent impaired, limited or restricted Mobility: Walking and Moving Around Goal Status [phone]): At least 60 percent but less than 80 percent impaired, limited or restricted Mobility: Walking and Moving Around Discharge Status [phone]): At least 60 percent but less than 80 percent impaired, limited or restricted    10:07 AM, 12/04/17 Lonell Grandchild, MPT Physical  Therapist with [facility] [phone] office [phone] mobile phone

## 2017-12-04 NOTE — Progress Notes (Signed)
Removed IV-clean, dry, and intact. Reviewed d/c paperwork with pt and discussed home meds. Wheeled stable pt and belongings to meet son at main entrance. I reviewed d/c paperwork with son. I discussed prescriptions and home meds.

## 2017-12-04 NOTE — Discharge Instructions (Signed)
Follow with Primary MD  Mitzie Na, MD  and other consultant's as instructed your Hospitalist MD  Please get a complete blood count and chemistry panel checked by your Primary MD at your next visit, and again as instructed by your Primary MD.  Get Medicines reviewed and adjusted: Please take all your medications with you for your next visit with your Primary MD  Laboratory/radiological data: Please request your Primary MD to go over all hospital tests and procedure/radiological results at the follow up, please ask your Primary MD to get all Hospital records sent to his/her office.  In some cases, they will be blood work, cultures and biopsy results pending at the time of your discharge. Please request that your primary care M.D. follows up on these results.  Also Note the following: If you experience worsening of your admission symptoms, develop shortness of breath, life threatening emergency, suicidal or homicidal thoughts you must seek medical attention immediately by calling 911 or calling your MD immediately  if symptoms less severe.  You must read complete instructions/literature along with all the possible adverse reactions/side effects for all the Medicines you take and that have been prescribed to you. Take any new Medicines after you have completely understood and accpet all the possible adverse reactions/side effects.   Do not drive when taking Pain medications or sleeping medications (Benzodaizepines)  Do not take more than prescribed Pain, Sleep and Anxiety Medications. It is not advisable to combine anxiety,sleep and pain medications without talking with your primary care practitioner  Special Instructions: If you have smoked or chewed Tobacco  in the last 2 yrs please stop smoking, stop any regular Alcohol  and or any Recreational drug use.  Wear Seat belts while driving.  Please note: You were cared for by a hospitalist during your hospital stay. Once you are  discharged, your primary care physician will handle any further medical issues. Please note that NO REFILLS for any discharge medications will be authorized once you are discharged, as it is imperative that you return to your primary care physician (or establish a relationship with a primary care physician if you do not have one) for your post hospital discharge needs so that they can reassess your need for medications and monitor your lab values.

## 2017-12-07 LAB — CULTURE, BLOOD (ROUTINE X 2)
Culture: NO GROWTH
Culture: NO GROWTH
SPECIAL REQUESTS: ADEQUATE

## 2018-01-19 DEATH — deceased

## 2018-08-15 IMAGING — CT CT RENAL STONE PROTOCOL
2 of 4 series · 15 of 46 positions shown, 17 images · non-contrast
Comparison: CT Abdomen and Pelvis 11/09/2016.

CLINICAL DATA: [AGE] male with right flank pain for 3 days.
Vomiting beginning this morning. Shortness of breath and hypoxia.

EXAM:
CT ABDOMEN AND PELVIS WITHOUT CONTRAST
TECHNIQUE: Multidetector CT imaging of the abdomen and pelvis was performed
following the standard protocol without IV contrast.

[Series 2: axial st · axial · 0.76mm/px · z∈[+674,+1114]mm · 12 of 96 slices shown, 14 images]
[im 4/96  soft-tissue]
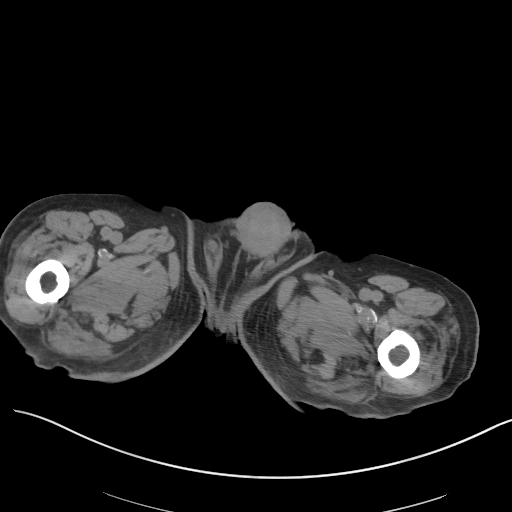
[im 4/96  bone]
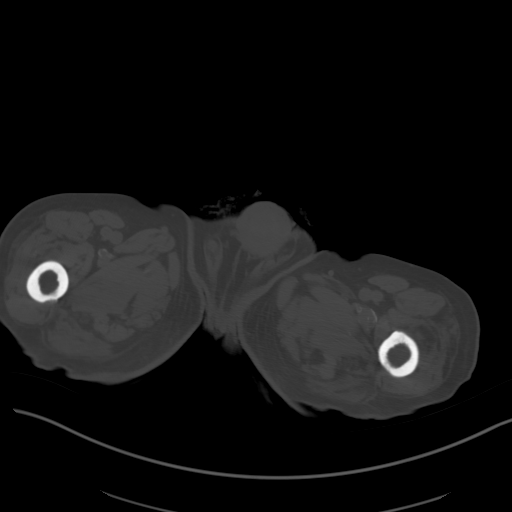
[im 12/96  soft-tissue]
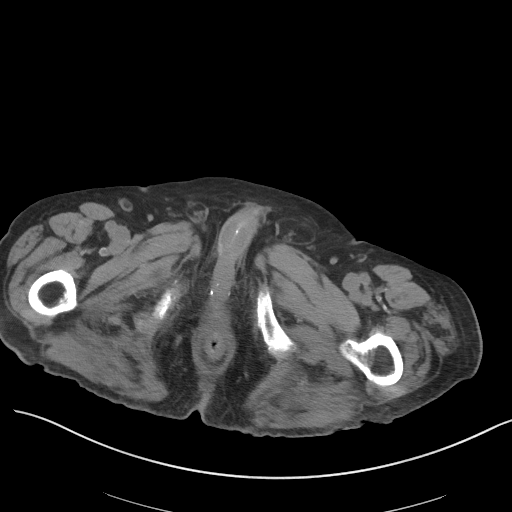
[im 20/96  soft-tissue]
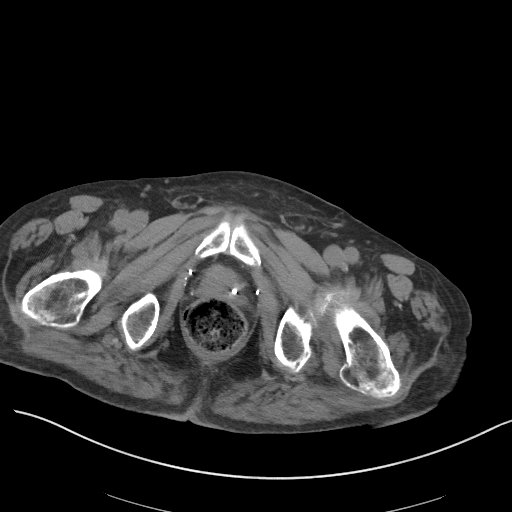
[im 28/96  soft-tissue]
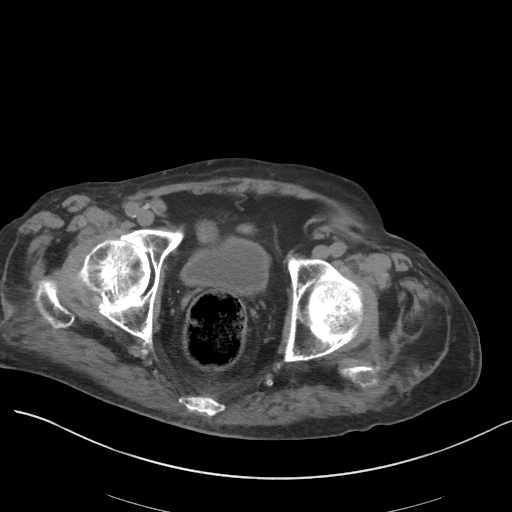
[im 36/96  soft-tissue]
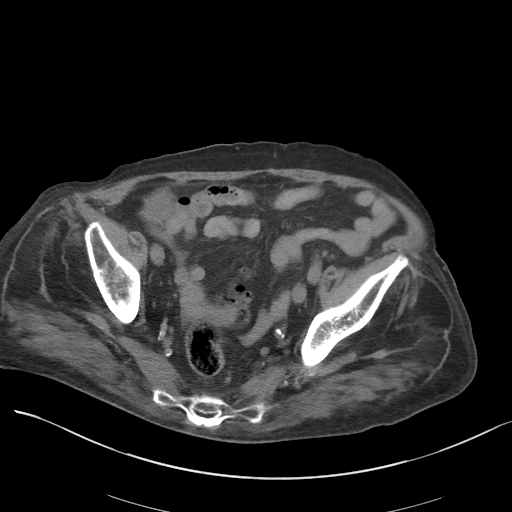
[im 44/96  soft-tissue]
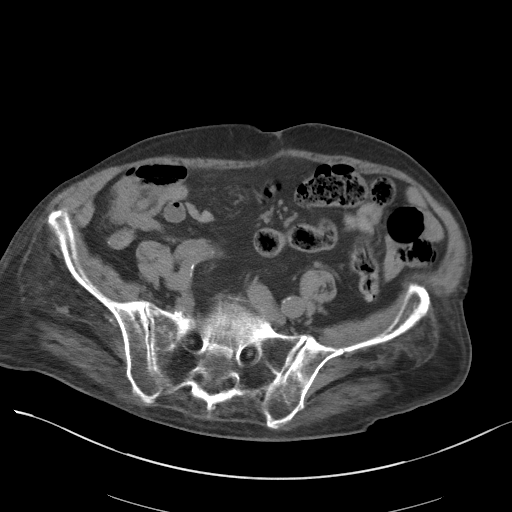
[im 52/96  soft-tissue]
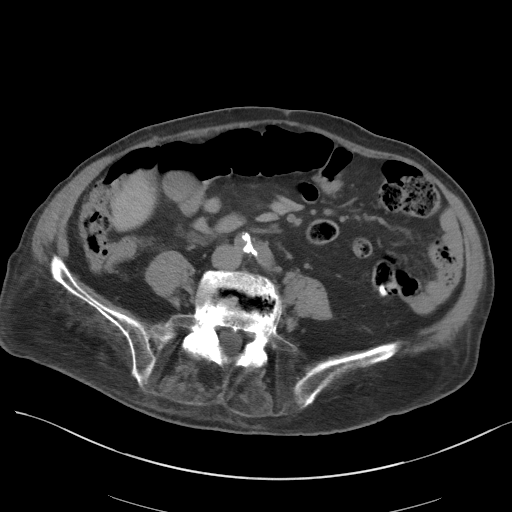
[im 60/96  soft-tissue]
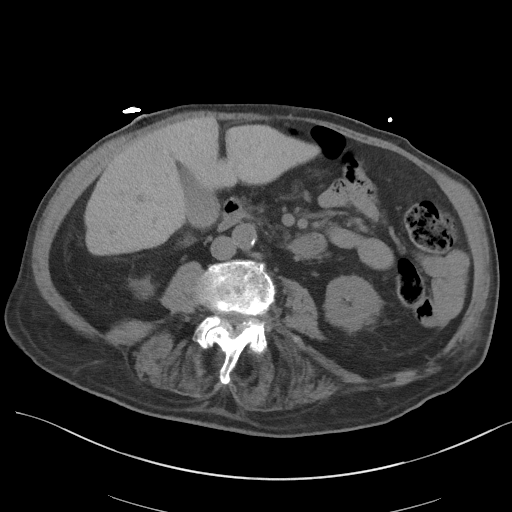
[im 68/96  soft-tissue]
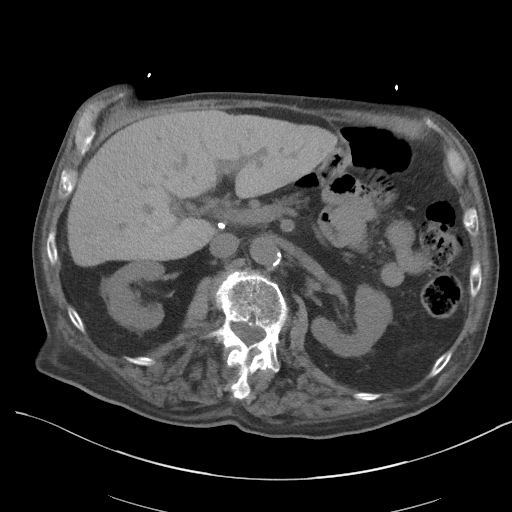
[im 68/96  bone]
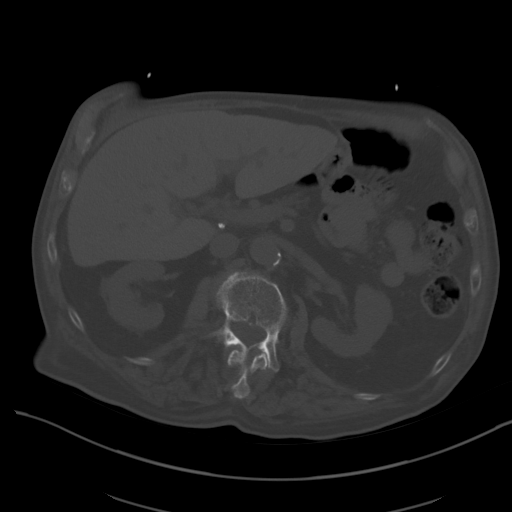
[im 76/96  soft-tissue]
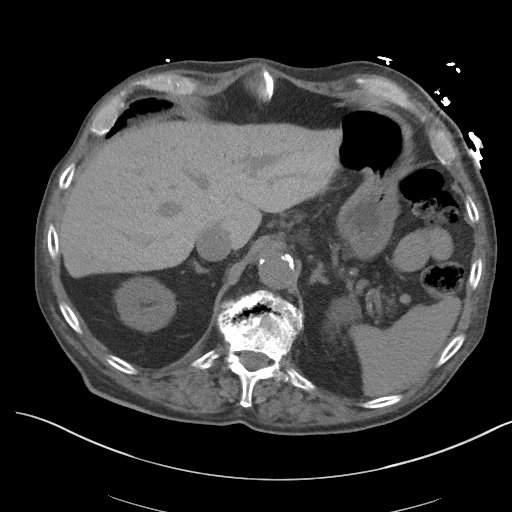
[im 84/96  soft-tissue]
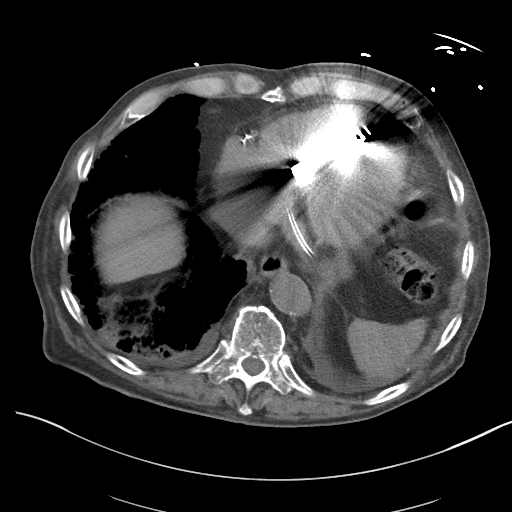
[im 92/96  soft-tissue]
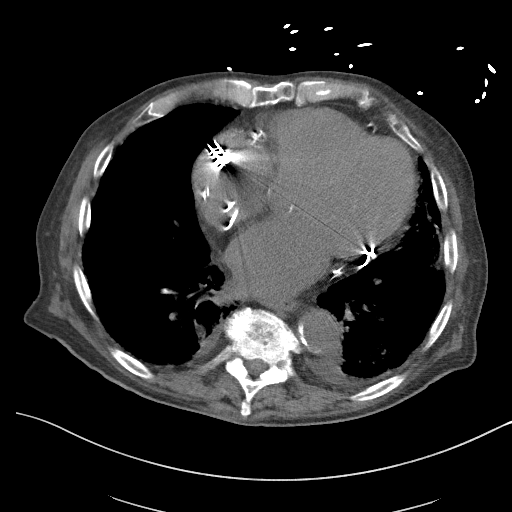

[Series 5: coronal st · coronal · 0.80mm/px · 3 of 101 slices shown]
[im 34/101  soft-tissue]
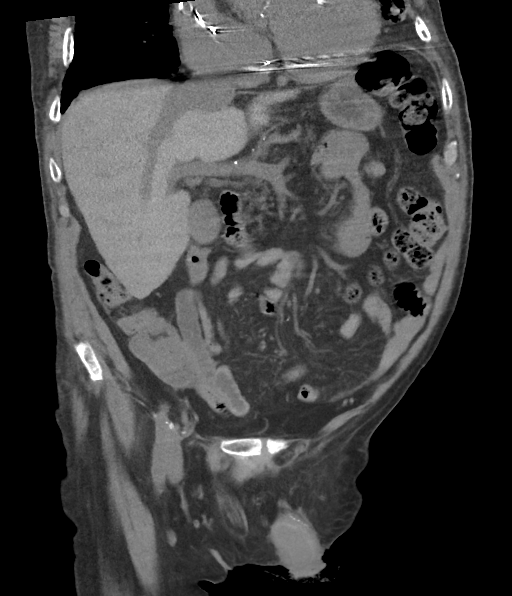
[im 45/101  soft-tissue]
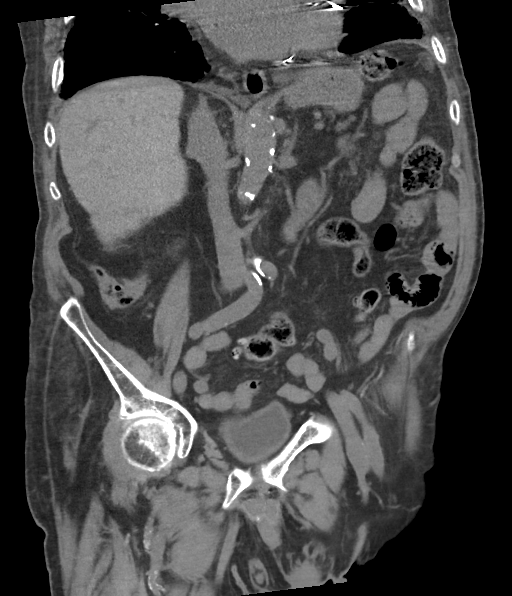
[im 56/101  soft-tissue]
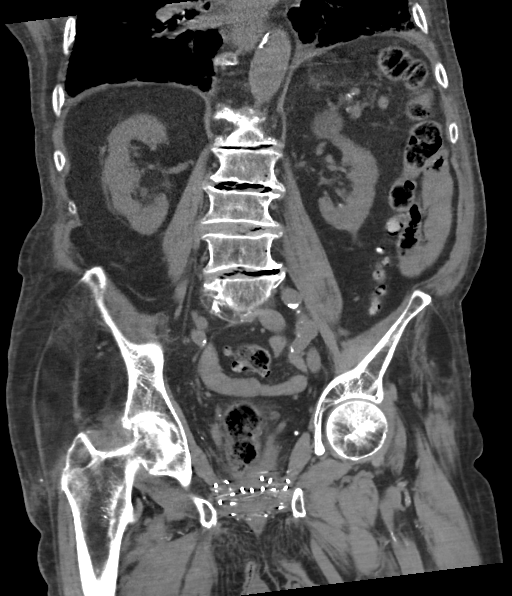

[15 of 46 positions shown; findings below may reference images not displayed]

FINDINGS: Lower chest: Cardiomegaly appears mildly progressed. No pericardial
effusion. Small layering pleural effusions greater on the left. New
Patchy and confluent bilateral lower lobe opacity, greater on the
left. Mild left lingula involvement.

Hepatobiliary: Mildly distended hepatic IVC and central hepatic
veins. Otherwise negative noncontrast liver. Small layering
cholelithiasis or sludge but no pericholecystic inflammation.

Pancreas: Atrophied.

Spleen: Negative.

Adrenals/Urinary Tract: Normal adrenal glands.

Punctate bilateral nephrolithiasis, more apparent on the left
([place]pole coronal image 68). No hydronephrosis. Chronic left upper
pole renal cyst. No definite acute pararenal stranding.

Negative course of the left ureter.

Negative course of the right ureter.  Both ureters are decompressed.

Diminutive urinary bladder today. Small chronic left fundal bladder
diverticulum incidentally noted (coronal image 56). No perivesical
stranding.

Stomach/Bowel: Mild to moderate stool ball in the rectum. Retained
stool in the sigmoid colon. Moderate sigmoid diverticulosis.
Continuing into the left colon. No active inflammation identified.
Redundant sigmoid. Redundant transverse colon with retained stool
distally. Less redundant right colon. Sequelae of appendectomy. Mild
right colon diverticulosis. No active inflammation. Decompressed
terminal ileum. No dilated small bowel. Stomach and duodenum within
normal limits.

No abdominal free air or free fluid.

Vascular/Lymphatic: Vascular patency is not evaluated in the absence
of IV contrast. Aortoiliac calcified atherosclerosis. New line no
lymphadenopathy.

Reproductive: Sequelae of prostate brachy therapy.

Other: Mild presacral stranding is new.  No pelvic free fluid.

Musculoskeletal: Prior sternotomy. Stable visible spine with chronic
scoliosis, spondylolisthesis and occasional compression fractures.
No acute osseous abnormality identified.
IMPRESSION: 1. Small layering pleural effusions and left greater than right
confluent lower lobe pulmonary opacity. Consider aspiration or
pneumonia.
2. Mild nonspecific presacral stranding is new since 7712. No
convincing distal bowel inflammation despite sigmoid diverticulosis.
Although there is a new mild to moderate stool ball in the rectum -
consider fecal impaction.
3. Punctate bilateral nephrolithiasis without acute obstructive
uropathy. Diminutive urinary bladder with small chronic bladder
diverticulum.
4. Dependent sludge or stones in the gallbladder but no CT evidence
of acute cholecystitis.
5. Cardiomegaly appears mildly progressed since 7712. Aortic
Atherosclerosis ([id number]).

## 2018-08-16 IMAGING — CR DG CHEST 1V PORT
1 series · 1 of 1 positions shown · non-contrast
Comparison: 12/02/2017 and earlier.

CLINICAL DATA: [AGE] male with recent flank pain vomiting and
shortness of breath.

EXAM:
PORTABLE CHEST 1 VIEW

[portable]
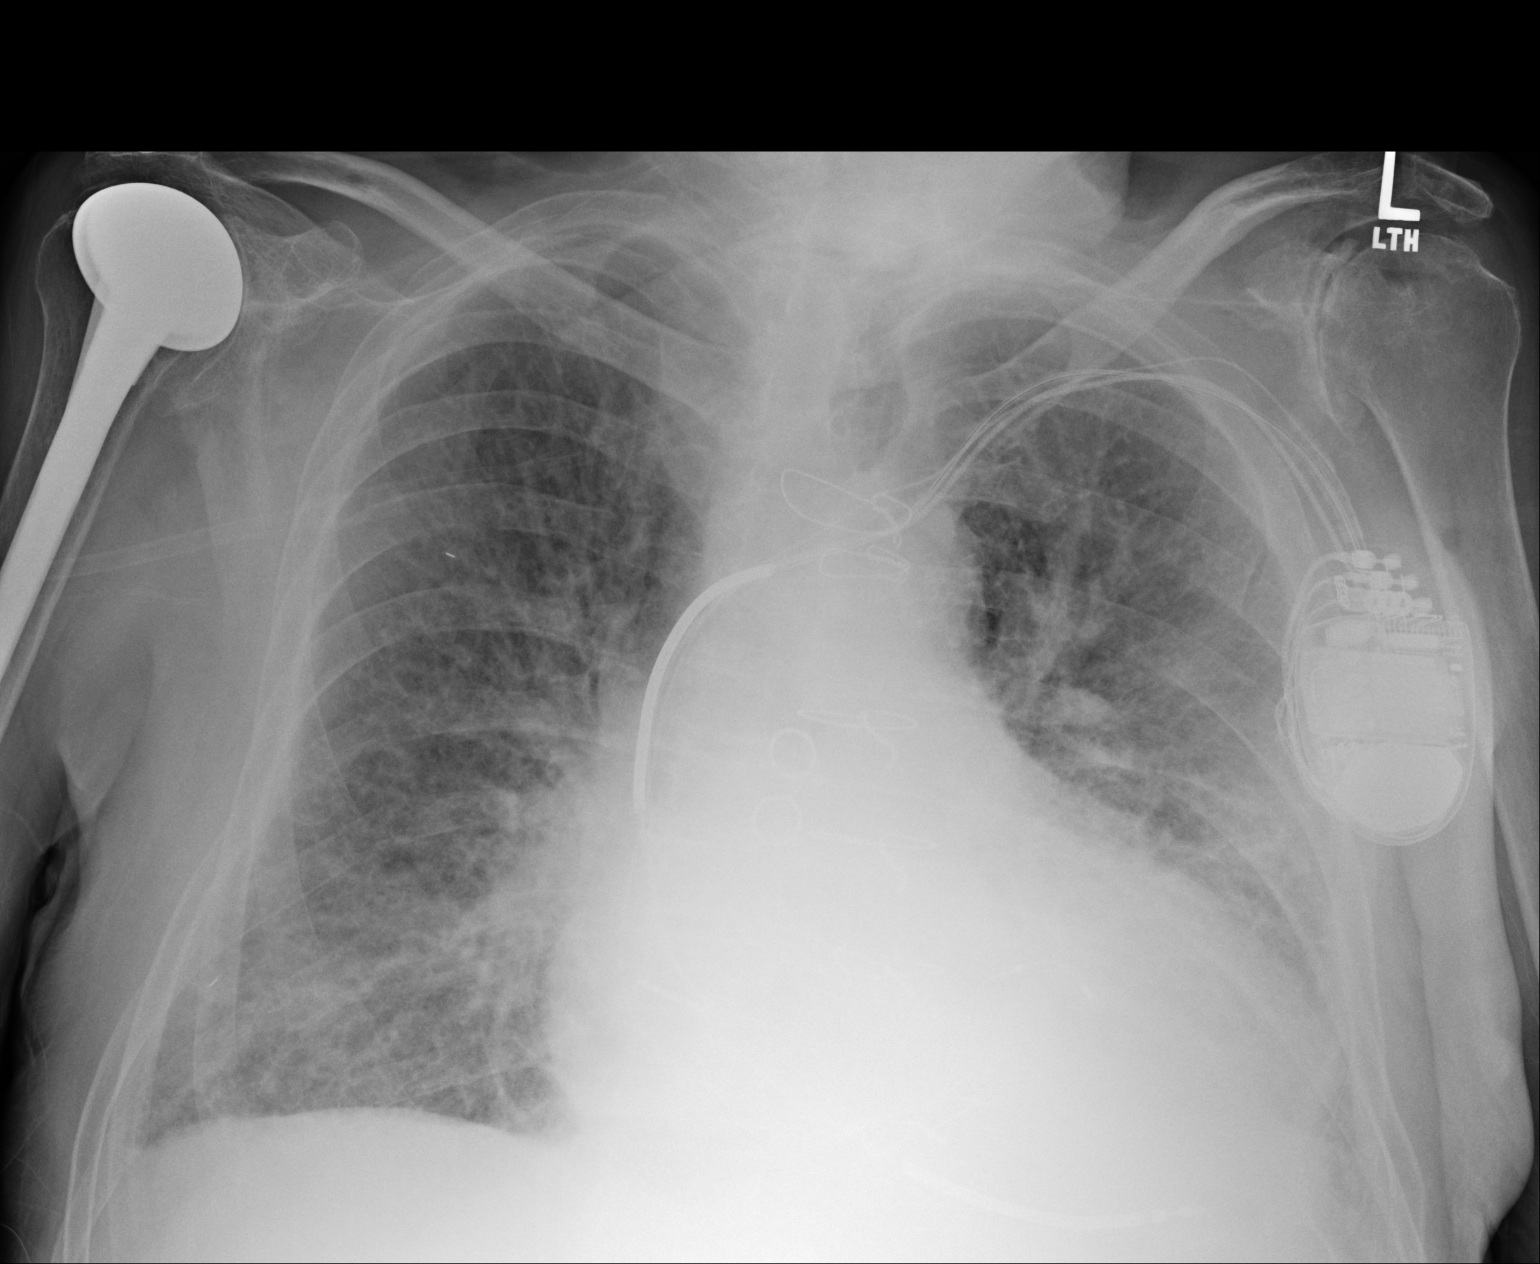

[1 of 1 positions shown; findings below may reference images not displayed]

FINDINGS: Portable AP upright view at 8186 hours. Larger lung volumes but
increased dense retrocardiac and left lung base opacity now
obscuring the left hemidiaphragm. Stable cardiomegaly and
mediastinal contours. Stable left chest AICD. Prior CABG.

No pneumothorax. Increasing bilateral interstitial opacity. Small
bilateral pleural effusions better demonstrated by CT yesterday.
IMPRESSION: 1. Larger lung volumes but worsening left lung base opacity now
obscuring the left hemidiaphragm, suspicious for progressed
pneumonia.
2. Increasing bilateral pulmonary interstitial opacity. Consider
progressed viral/atypical respiratory infection versus pulmonary
interstitial edema.
